# Patient Record
Sex: Female | Born: 1985 | Race: Black or African American | Hispanic: No | Marital: Single | Smoking: Never smoker
Health system: Southern US, Community
[De-identification: ages and names within clinical notes are randomized; demographics above are authoritative.]

## PROBLEM LIST (undated history)

## (undated) DIAGNOSIS — F329 Major depressive disorder, single episode, unspecified: Secondary | ICD-10-CM

## (undated) DIAGNOSIS — F32A Depression, unspecified: Secondary | ICD-10-CM

## (undated) DIAGNOSIS — F419 Anxiety disorder, unspecified: Secondary | ICD-10-CM

---

## 2006-05-05 ENCOUNTER — Ambulatory Visit: Payer: Self-pay | Admitting: Family Medicine

## 2006-05-05 LAB — CONVERTED CEMR LAB
CO2: 27 meq/L (ref 19–32)
Creatinine, Ser: 0.9 mg/dL (ref 0.4–1.2)
Glucose, Bld: 85 mg/dL (ref 70–99)
Potassium: 4 meq/L (ref 3.5–5.1)
Sodium: 138 meq/L (ref 135–145)

## 2006-05-06 ENCOUNTER — Ambulatory Visit (HOSPITAL_COMMUNITY): Admission: RE | Admit: 2006-05-06 | Discharge: 2006-05-06 | Payer: Self-pay | Admitting: Family Medicine

## 2016-07-11 ENCOUNTER — Emergency Department (HOSPITAL_COMMUNITY)
Admission: EM | Admit: 2016-07-11 | Discharge: 2016-07-11 | Disposition: A | Discharge status: Home / Self Care | Payer: No Typology Code available for payment source | Attending: Emergency Medicine | Admitting: Emergency Medicine

## 2016-07-11 ENCOUNTER — Emergency Department (HOSPITAL_COMMUNITY): Payer: No Typology Code available for payment source

## 2016-07-11 ENCOUNTER — Encounter (HOSPITAL_COMMUNITY): Payer: Self-pay | Admitting: Emergency Medicine

## 2016-07-11 DIAGNOSIS — M25511 Pain in right shoulder: Secondary | ICD-10-CM | POA: Insufficient documentation

## 2016-07-11 DIAGNOSIS — Y939 Activity, unspecified: Secondary | ICD-10-CM | POA: Diagnosis not present

## 2016-07-11 DIAGNOSIS — F17200 Nicotine dependence, unspecified, uncomplicated: Secondary | ICD-10-CM | POA: Insufficient documentation

## 2016-07-11 DIAGNOSIS — M545 Low back pain, unspecified: Secondary | ICD-10-CM

## 2016-07-11 DIAGNOSIS — Y92410 Unspecified street and highway as the place of occurrence of the external cause: Secondary | ICD-10-CM | POA: Diagnosis not present

## 2016-07-11 DIAGNOSIS — Y999 Unspecified external cause status: Secondary | ICD-10-CM | POA: Diagnosis not present

## 2016-07-11 MED ORDER — IBUPROFEN 600 MG PO TABS: 600.0000 mg | ORAL_TABLET | Freq: Four times a day (QID) | ORAL | 0 refills | Status: DC | PRN

## 2016-07-11 MED ORDER — IBUPROFEN 400 MG PO TABS
600.0000 mg | ORAL_TABLET | Freq: Once | ORAL | Status: AC
  Administered 2016-07-11: 600 mg via ORAL
  Filled 2016-07-11: qty 1

## 2016-07-11 MED ORDER — CYCLOBENZAPRINE HCL 5 MG PO TABS: 5.0000 mg | ORAL_TABLET | Freq: Two times a day (BID) | ORAL | 0 refills | Status: DC | PRN

## 2016-07-11 MED ORDER — CYCLOBENZAPRINE HCL 10 MG PO TABS
5.0000 mg | ORAL_TABLET | Freq: Once | ORAL | Status: AC
  Administered 2016-07-11: 5 mg via ORAL
  Filled 2016-07-11: qty 1
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
## 2016-12-14 ENCOUNTER — Ambulatory Visit (HOSPITAL_COMMUNITY)
Admission: RE | Admit: 2016-12-14 | Discharge: 2016-12-14 | Disposition: A | Discharge status: Home / Self Care | Payer: Self-pay | Attending: Psychiatry | Admitting: Psychiatry

## 2016-12-14 ENCOUNTER — Encounter (HOSPITAL_COMMUNITY): Payer: Self-pay | Admitting: *Deleted

## 2016-12-14 ENCOUNTER — Emergency Department (HOSPITAL_COMMUNITY)
Admission: EM | Admit: 2016-12-14 | Discharge: 2016-12-15 | Disposition: A | Discharge status: Home / Self Care | Payer: Self-pay | Attending: Emergency Medicine | Admitting: Emergency Medicine

## 2016-12-14 DIAGNOSIS — Z008 Encounter for other general examination: Secondary | ICD-10-CM | POA: Insufficient documentation

## 2016-12-14 DIAGNOSIS — F411 Generalized anxiety disorder: Secondary | ICD-10-CM | POA: Diagnosis present

## 2016-12-14 DIAGNOSIS — Z1330 Encounter for screening examination for mental health and behavioral disorders, unspecified: Secondary | ICD-10-CM | POA: Insufficient documentation

## 2016-12-14 DIAGNOSIS — F39 Unspecified mood [affective] disorder: Secondary | ICD-10-CM | POA: Insufficient documentation

## 2016-12-14 DIAGNOSIS — F17210 Nicotine dependence, cigarettes, uncomplicated: Secondary | ICD-10-CM | POA: Insufficient documentation

## 2016-12-14 DIAGNOSIS — F319 Bipolar disorder, unspecified: Secondary | ICD-10-CM | POA: Insufficient documentation

## 2016-12-14 HISTORY — DX: Anxiety disorder, unspecified: F41.9

## 2016-12-14 HISTORY — DX: Major depressive disorder, single episode, unspecified: F32.9

## 2016-12-14 HISTORY — DX: Depression, unspecified: F32.A

## 2016-12-14 MED ORDER — NICOTINE 21 MG/24HR TD PT24: 21.0000 mg | MEDICATED_PATCH | Freq: Every day | TRANSDERMAL | Status: DC

## 2016-12-14 MED ORDER — IBUPROFEN 200 MG PO TABS: 600.0000 mg | ORAL_TABLET | Freq: Three times a day (TID) | ORAL | Status: DC | PRN

## 2016-12-14 MED ORDER — ONDANSETRON HCL 4 MG PO TABS: 4.0000 mg | ORAL_TABLET | Freq: Three times a day (TID) | ORAL | Status: DC | PRN

## 2016-12-14 NOTE — ED Triage Notes (Signed)
Pt lives with her mother and [facility] was concerned [facility]  [facility] get [facility].  Pts mom main concern is the pts depression, anger and verbally aggressive.  Pt "promised" her parents she would be evaluated.  Denies SI/HI.  Denies AVH.

## 2016-12-14 NOTE — BH Assessment (Signed)
Amand[facility]Spencer Simon[facility]. TTS to work on placement. Assessment has been completed.

## 2016-12-14 NOTE — BH Assessment (Signed)
Assessment Note  Amanda Burnett is an 31 y.o. female presenting to [facility] with her mother and a family friend. The patient presents with manic behavior, grandiose thought, mood instability and reports of decreased sleep. The patient was labile, mumbling to herself and times, had rapid speech. Denied regular drug use but reports occasional use of cannabis. The patient lost her housing recently, reportedly cant hold a job and unable to finish college course work in the past.   The patient denies previous inpatient or outpatient. The patient has the support of her mother. Denies SI, HI and A/V but admits she is not functioning well.  The patient had fair eye contact, was restless, had impaired judgment and insight.   Donell Sievert, NP recommends inpatient treatment.   Diagnosis: r/o Bipolar I disorder, recent episode manic  Past Medical History: No past medical history on file.  No past surgical history on file.  Family History: No family history on file.  Social History:  reports that she has been smoking.  She has never used smokeless tobacco. She reports that she drinks alcohol. She reports that she does not use drugs.  Additional Social History:  Alcohol / Drug Use Pain Medications: see MAR Prescriptions: see MAR Over the Counter: see MAR History of alcohol / drug use?: Yes Substance #1 Name of Substance 1: cannabis 1 - Last Use / Amount: occasional use  CIWA:   COWS:    Allergies: No Known Allergies  Home Medications:  (Not in a hospital admission)  OB/GYN Status:  No LMP recorded.  General Assessment Data Location of Assessment: [facility] Assessment Services TTS Assessment: In system Is this a Tele or Face-to-Face Assessment?: Face-to-Face Is this an Initial Assessment or a Re-assessment for this encounter?: Initial Assessment Marital status: Single Maiden name: Dohmen Is patient pregnant?: No Pregnancy Status: No Living Arrangements: Parent Can pt return to current living  arrangement?: Yes Admission Status: Voluntary Is patient capable of signing voluntary admission?: Yes Referral Source: Self/Family/Friend Insurance type: self pay  Medical Screening Exam [facility] Walk-in ONLY) Medical Exam completed: Yes  Crisis Care Plan Living Arrangements: Parent Name of Psychiatrist: n/a Name of Therapist: n/a  Education Status Is patient currently in school?: No Highest grade of school patient has completed: some college  Risk to self with the past 6 months Suicidal Ideation: No Has patient been a risk to self within the past 6 months prior to admission? : No Suicidal Intent: No Has patient had any suicidal intent within the past 6 months prior to admission? : No Is patient at risk for suicide?: No Suicidal Plan?: No Has patient had any suicidal plan within the past 6 months prior to admission? : No Access to Means: No What has been your use of drugs/alcohol within the last 12 months?: n/a Previous Attempts/Gestures: No How many times?: 0 Other Self Harm Risks: 0 Triggers for Past Attempts: None known Intentional Self Injurious Behavior: None Family Suicide History: Unknown Recent stressful life event(s): Other (Comment) Persecutory voices/beliefs?: No Depression: No Substance abuse history and/or treatment for substance abuse?: No Suicide prevention information given to non-admitted patients: Not applicable  Risk to Others within the past 6 months Homicidal Ideation: No Does patient have any lifetime risk of violence toward others beyond the six months prior to admission? : No Thoughts of Harm to Others: No Current Homicidal Intent: No Current Homicidal Plan: No Access to Homicidal Means: No Identified Victim: n/a History of harm to others?: No Assessment of Violence: None Noted Violent  Behavior Description: n/a Does patient have access to weapons?: No Criminal Charges Pending?: No Does patient have a court date: No Is patient on probation?:  No  Psychosis Hallucinations: None noted Delusions: Grandiose, Unspecified  Mental Status Report Appearance/Hygiene: Unremarkable Eye Contact: Fair Motor Activity: Restlessness Speech: Rapid Level of Consciousness: Restless Mood: Labile Affect: Labile Anxiety Level: Minimal Thought Processes: Coherent, Relevant Judgement: Impaired Orientation: Person, Place, Time, Situation Obsessive Compulsive Thoughts/Behaviors: None  Cognitive Functioning Concentration: Normal Memory: Recent Intact, Remote Intact IQ: Average Insight: Good Impulse Control: Good Appetite: Fair Weight Loss: 0 Weight Gain: 0 Sleep: Decreased Vegetative Symptoms: None  ADLScreening [facility] Patient's cognitive ability adequate to safely complete daily activities?: Yes Patient able to express need for assistance with ADLs?: Yes Independently performs ADLs?: Yes (appropriate for developmental age)  Prior Inpatient Therapy Prior Inpatient Therapy: No  Prior Outpatient Therapy Prior Outpatient Therapy: No Does patient have an ACCT team?: No Does patient have Intensive In-House Services?  : No Does patient have [facility] services? : No Does patient have P4CC services?: No  ADL Screening (condition at time of admission) Patient's cognitive ability adequate to safely complete daily activities?: Yes Is the patient deaf or have difficulty hearing?: No Does the patient have difficulty seeing, even when wearing glasses/contacts?: No Does the patient have difficulty concentrating, remembering, or making decisions?: No Patient able to express need for assistance with ADLs?: Yes Does the patient have difficulty dressing or bathing?: No Independently performs ADLs?: Yes (appropriate for developmental age)       Abuse/Neglect Assessment (Assessment to be complete while patient is alone) Physical Abuse:  (UTA) Verbal Abuse:  (UTA) Sexual Abuse:  (UTA)     Advance Directives (For  Healthcare) Does Patient Have a Medical Advance Directive?: No    Additional Information 1:1 In Past 12 Months?: No CIRT Risk: No Elopement Risk: No Does patient have medical clearance?: Yes     Disposition:  Disposition Initial Assessment Completed for this Encounter: Yes Disposition of Patient: Inpatient treatment program Type of inpatient treatment program: Adult  On Site Evaluation by:   Reviewed with Physician:  Donell SievertSpencer Simon, NP  Vonzell SchlatterAshley H Othon Guardia 12/14/2016 10:40 PM

## 2016-12-14 NOTE — ED Notes (Signed)
ED Provider at bedside. 

## 2016-12-14 NOTE — ED Notes (Signed)
Urine specimen requested 

## 2016-12-14 NOTE — ED Provider Notes (Signed)
[facility]-EMERGENCY DEPT Provider Note   CSN: [id number] Arrival date & time: 12/14/16  2215     History   Chief Complaint Chief Complaint  Patient presents with  . Medical Clearance    HPI Amanda Burnett is a 31 y.o. female.  31 year old female presents from behavioral health for medical clearance.  She was seen earlier today and recommended for inpatient treatment.  Patient denies any suicidal or homicidal ideations.  She was brought to behavioral health by her mother for depression, anger, and verbal aggression.  Patient has no complaints at this time.  TTS seeking placement.      Past Medical History:  Diagnosis Date  . Anxiety   . Depression     There are no active problems to display for this patient.   History reviewed. No pertinent surgical history.  OB History    No data available       Home Medications    Prior to Admission medications   Not on File    Family History History reviewed. No pertinent family history.  Social History Social History  Substance Use Topics  . Smoking status: Current Some Day Smoker  . Smokeless tobacco: Never Used     Comment: socially  . Alcohol use Yes     Comment: drinks 2 days a week, beer, wine & liquor, binge drinking     Allergies   Patient has no known allergies.   Review of Systems Review of Systems Ten systems reviewed and are negative for acute change, except as noted in the HPI.    Physical Exam Updated Vital Signs BP 134/83 (BP Location: Right Arm)   Pulse 93   Temp 99.6 F (37.6 C) (Oral)   Resp 16   Ht 5\' 6"  (1.676 m)   Wt 56.7 kg (125 lb)   LMP 11/14/2016 (Approximate)   SpO2 100%   BMI 20.18 kg/m   Physical Exam  Constitutional: She is oriented to person, place, and time. She appears well-developed and well-nourished. No distress.  Nontoxic appearing; pleasant  HENT:  Head: Normocephalic and atraumatic.  Eyes: Conjunctivae and EOM are normal. No  scleral icterus.  Neck: Normal range of motion.  Cardiovascular: Normal rate, regular rhythm and intact distal pulses.   Pulmonary/Chest: Effort normal. No respiratory distress.  Respirations even and unlabored  Musculoskeletal: Normal range of motion.  Neurological: She is alert and oriented to person, place, and time. She exhibits normal muscle tone. Coordination normal.  Skin: Skin is warm and dry. No rash noted. She is not diaphoretic. No erythema. No pallor.  Psychiatric: She has a normal mood and affect. Her behavior is normal.  Nursing note and vitals reviewed.    ED Treatments / Results  Labs (all labs ordered are listed, but only abnormal results are displayed) Labs Reviewed  COMPREHENSIVE METABOLIC PANEL - Abnormal; Notable for the following:       Result Value   CO2 21 (*)    Glucose, Bld 111 (*)    All other components within normal limits  ETHANOL  CBC  RAPID URINE DRUG SCREEN, HOSP PERFORMED  I-STAT BETA HCG BLOOD, ED ([facility], [facility], [facility] ONLY)    EKG  EKG Interpretation None       Radiology No results found.  Procedures Procedures (including critical care time)  Medications Ordered in ED Medications  ibuprofen (ADVIL,MOTRIN) tablet 600 mg (not administered)  ondansetron (ZOFRAN) tablet 4 mg (not administered)  nicotine (NICODERM CQ - dosed in mg/24 hours)  patch 21 mg (not administered)     Initial Impression / Assessment and Plan / ED Course  I have reviewed the triage vital signs and the nursing notes.  Pertinent labs & imaging results that were available during my care of the patient were reviewed by me and considered in my medical decision making (see chart for details).     [facility]  Psychiatric team recommending inpatient admission.  Placement pending.  The patient has been medically cleared.  Disposition to be determined by oncoming ED provider.   Final Clinical Impressions(s) / ED Diagnoses   Final  diagnoses:  Mood disorder [facility](HCC)  Medical clearance for psychiatric admission    New Prescriptions New Prescriptions   No medications on file     Antony MaduraHumes, Jedediah Noda, Cordelia Poche-C 12/15/16 0304    Shon BatonHorton, Courtney F, MD 12/15/16 [phone]

## 2016-12-15 DIAGNOSIS — R450 Nervousness: Secondary | ICD-10-CM

## 2016-12-15 DIAGNOSIS — F17210 Nicotine dependence, cigarettes, uncomplicated: Secondary | ICD-10-CM

## 2016-12-15 DIAGNOSIS — F309 Manic episode, unspecified: Secondary | ICD-10-CM

## 2016-12-15 DIAGNOSIS — F419 Anxiety disorder, unspecified: Secondary | ICD-10-CM

## 2016-12-15 DIAGNOSIS — F411 Generalized anxiety disorder: Secondary | ICD-10-CM

## 2016-12-15 LAB — COMPREHENSIVE METABOLIC PANEL
ALBUMIN: 4.6 g/dL (ref 3.5–5.0)
ALT: 16 U/L (ref 14–54)
AST: 19 U/L (ref 15–41)
Alkaline Phosphatase: 73 U/L (ref 38–126)
Anion gap: 10 (ref 5–15)
BILIRUBIN TOTAL: 1.1 mg/dL (ref 0.3–1.2)
BUN: 12 mg/dL (ref 6–20)
CHLORIDE: 106 mmol/L (ref 101–111)
CO2: 21 mmol/L — AB (ref 22–32)
Calcium: 9.2 mg/dL (ref 8.9–10.3)
Creatinine, Ser: 0.93 mg/dL (ref 0.44–1.00)
GFR calc Af Amer: >60 mL/min (ref >60)
GFR calc non Af Amer: >60 mL/min (ref >60)
GLUCOSE: 111 mg/dL — AB (ref 65–99)
POTASSIUM: 3.6 mmol/L (ref 3.5–5.1)
Sodium: 137 mmol/L (ref 135–145)
TOTAL PROTEIN: 7.9 g/dL (ref 6.5–8.1)

## 2016-12-15 LAB — CBC
HEMATOCRIT: 40.7 % (ref 36.0–46.0)
HEMOGLOBIN: 14.1 g/dL (ref 12.0–15.0)
MCH: 31.6 pg (ref 26.0–34.0)
MCHC: 34.6 g/dL (ref 30.0–36.0)
MCV: 91.3 fL (ref 78.0–100.0)
Platelets: 273 10*3/uL (ref 150–400)
RBC: 4.46 MIL/uL (ref 3.87–5.11)
RDW: 12.7 % (ref 11.5–15.5)
WBC: 6 10*3/uL (ref 4.0–10.5)

## 2016-12-15 LAB — PREGNANCY, URINE: Preg Test, Ur: NEGATIVE

## 2016-12-15 LAB — ETHANOL: Alcohol, Ethyl (B): <10 mg/dL (ref <10)

## 2016-12-15 NOTE — BH Assessment (Signed)
[facility] Assessment Progress Note  Per Jacqueline Norman, DO, this pt does not require psychiatric hospitalization at this time.  Pt is to be discharged from [facility] with recommendation to follow up with [facility].  This has been included in pt's discharge instructions.  Pt's nurse, Cynthia, has been notified.  Sacha Radloff, MA Triage Specialist [phone]     

## 2016-12-15 NOTE — Consult Note (Signed)
[facility] Consult   Reason for Consult:  Manic behavior Referring Physician:  EDP Patient Identification: Amanda Burnett MRN:  [id number] Principal Diagnosis: Generalized anxiety disorder Diagnosis:   Patient Active Problem List   Diagnosis Date Noted  . Generalized anxiety disorder [F41.1] 12/15/2016    Total Time spent with patient: 45 minutes  Subjective:   Amanda Burnett is a 31 y.o. female patient admitted with increased anxiety and manic behavior.  HPI:  Pt was seen and chart reviewed with treatment team and Dr Mariea Clonts. Pt stated she is currently living with her mother and her mother became concerned because she has been having excessive worry and increased anxiety. Pt stated she currently is not working and goes to bed early and getting up early. Pt stated she thinks her mother may be mistaking this as her not sleeping. Today,  Pt denies suicidal/homicidal ideation, denies auditory/visual hallucinations and does not appear to be responding to internal stimuli. Pt is calm and cooperative and appropriate during the assessment. Pt will be referred to [facility] for therapy to help with her anxiety. Pt is stable and psychiatrically clear for discharge.   Past Psychiatric History: As above  Risk to Self: Is patient at risk for suicide?: No Risk to Others:  None Prior Inpatient Therapy:  Denies  Prior Outpatient Therapy:   She has seen a therapist in the past.   Past Medical History:  Past Medical History:  Diagnosis Date  . Anxiety   . Depression    History reviewed. No pertinent surgical history. Family History: History reviewed. No pertinent family history. Family Psychiatric  History: Unknown Social History:  History  Alcohol Use  . Yes    Comment: drinks 2 days a week, beer, wine & liquor, binge drinking     History  Drug Use No    Social History   Social History  . Marital status: Single    Spouse name: N/A  . Number of  children: N/A  . Years of education: N/A   Social History Main Topics  . Smoking status: Current Some Day Smoker  . Smokeless tobacco: Never Used     Comment: socially  . Alcohol use Yes     Comment: drinks 2 days a week, beer, wine & liquor, binge drinking  . Drug use: No  . Sexual activity: Yes   Other Topics Concern  . None   Social History Narrative  . None   Additional Social History: She lives with her mother.     Allergies:  No Known Allergies  Labs:  Results for orders placed or performed during the hospital encounter of 12/14/16 (from the past 48 hour(s))  Comprehensive metabolic panel     Status: Abnormal   Collection Time: 12/14/16 11:47 PM  Result Value Ref Range   Sodium 137 135 - 145 mmol/L   Potassium 3.6 3.5 - 5.1 mmol/L   Chloride 106 101 - 111 mmol/L   CO2 21 (L) 22 - 32 mmol/L   Glucose, Bld 111 (H) 65 - 99 mg/dL   BUN 12 6 - 20 mg/dL   Creatinine, Ser 0.93 0.44 - 1.00 mg/dL   Calcium 9.2 8.9 - 10.3 mg/dL   Total Protein 7.9 6.5 - 8.1 g/dL   Albumin 4.6 3.5 - 5.0 g/dL   AST 19 15 - 41 U/L   ALT 16 14 - 54 U/L   Alkaline Phosphatase 73 38 - 126 U/L   Total Bilirubin 1.1 0.3 -  1.2 mg/dL   GFR calc non Af Amer >60 >60 mL/min   GFR calc Af Amer >60 >60 mL/min    Comment: (NOTE) The eGFR has been calculated using the CKD EPI equation. This calculation has not been validated in all clinical situations. eGFR's persistently <60 mL/min signify possible Chronic Kidney Disease.    Anion gap 10 5 - 15  Ethanol     Status: None   Collection Time: 12/14/16 11:47 PM  Result Value Ref Range   Alcohol, Ethyl (B) <10 <10 mg/dL    Comment:        LOWEST DETECTABLE LIMIT FOR SERUM ALCOHOL IS 10 mg/dL FOR MEDICAL PURPOSES ONLY   cbc     Status: None   Collection Time: 12/14/16 11:47 PM  Result Value Ref Range   WBC 6.0 4.0 - 10.5 K/uL   RBC 4.46 3.87 - 5.11 MIL/uL   Hemoglobin 14.1 12.0 - 15.0 g/dL   HCT 40.7 36.0 - 46.0 %   MCV 91.3 78.0 - 100.0 fL    MCH 31.6 26.0 - 34.0 pg   MCHC 34.6 30.0 - 36.0 g/dL   RDW 12.7 11.5 - 15.5 %   Platelets 273 150 - 400 K/uL  Pregnancy, urine     Status: None   Collection Time: 12/15/16  8:35 AM  Result Value Ref Range   Preg Test, Ur NEGATIVE NEGATIVE    Comment:        THE SENSITIVITY OF THIS METHODOLOGY IS >20 mIU/mL.     Current Facility-Administered Medications  Medication Dose Route Frequency Provider Last Rate Last Dose  . ibuprofen (ADVIL,MOTRIN) tablet 600 mg  600 mg Oral Q8H PRN Antonietta Breach, PA-C      . nicotine (NICODERM CQ - dosed in mg/24 hours) patch 21 mg  21 mg Transdermal Daily Humes, Claiborne Billings, PA-C      . ondansetron (ZOFRAN) tablet 4 mg  4 mg Oral Q8H PRN Antonietta Breach, PA-C       No current outpatient prescriptions on file.    Musculoskeletal: Strength & Muscle Tone: within normal limits Gait & Station: normal Patient leans: N/A  Psychiatric Specialty Exam: Physical Exam  Constitutional: She is oriented to person, place, and time. She appears well-developed and well-nourished.  HENT:  Head: Normocephalic.  Respiratory: Effort normal.  Musculoskeletal: Normal range of motion.  Neurological: She is alert and oriented to person, place, and time.  Psychiatric: Her speech is normal and behavior is normal. Judgment and thought content normal. Her mood appears anxious. Cognition and memory are normal.    Review of Systems  Psychiatric/Behavioral: Positive for depression. Negative for hallucinations, memory loss, substance abuse and suicidal ideas. The patient is nervous/anxious. The patient does not have insomnia.   All other systems reviewed and are negative.   Blood pressure 111/61, pulse 83, temperature 98.6 F (37 C), temperature source Oral, resp. rate 16, height _0  (1.676 m), weight 56.7 kg (125 lb), last menstrual period 11/14/2016, SpO2 100 %.Body mass index is 20.18 kg/m.  General Appearance: Casual  Eye Contact:  Good  Speech:  Clear and Coherent  Volume:   Normal  Mood:  Anxious  Affect:  Congruent  Thought Process:  Coherent, Goal Directed and Linear  Orientation:  Full (Time, Place, and Person)  Thought Content:  Logical  Suicidal Thoughts:  No  Homicidal Thoughts:  No  Memory:  Immediate;   Good Recent;   Good Remote;   Fair  Judgement:  Good  Insight:  Good  Psychomotor Activity:  Normal  Concentration:  Concentration: Good and Attention Span: Good  Recall:  Good  Fund of Knowledge:  Good  Language:  Good  Akathisia:  No  Handed:  Right  AIMS (if indicated):     Assets:  Communication Skills Desire for Improvement Financial Resources/Insurance Housing Physical Health Resilience Social Support  ADL's:  Intact  Cognition:  WNL  Sleep:   Okay     Treatment Plan Summary: Plan Generalized Anxiety Disorder  Discharge Home Follow up with [facility] Take all medications as prescribed Avoid the use of alcohol and illicit drugs  Disposition: No evidence of imminent risk to self or others at present.   Patient does not meet criteria for psychiatric inpatient admission. Supportive therapy provided about ongoing stressors. Discussed crisis plan, support from social network, calling 911, coming to the Emergency Department, and calling Suicide Hotline.  Ethelene Hal, NP 12/15/2016 1:05 PM   Patient seen face-to-face for psychiatric evaluation, chart reviewed and case discussed with the physician extender and developed treatment plan. Reviewed the information documented and agree with the treatment plan.  Buford Dresser, DO

## 2016-12-15 NOTE — H&P (Signed)
Behavioral Health Medical Screening Exam  Amanda Burnett is an 31 y.o. female whom presents with her mother and a family friend, due to exacerbated odd, erratic, impulsive and volatile behaviors by [place], over the past few months. Katianna is endorsing worrying, rumination, racing thoughts, mid-insomnia, impulsivity, mood dysregulation and AVH.The patient was labile, mumbling to herself and times, had rapid speech. Denied regular drug use but reports occasional use of cannabis. The patient lost her housing recently, reportedly cant hold a job and unable to finish college course work in the past.   The patient denies previous inpatient or outpatient. The patient has the support of her mother. Denies SI, HI and A/V but admits she is not functioning well.  The patient had fair eye contact, was restless, had impaired judgment and insight.   Total Time spent with patient: 30 minutes  Psychiatric Specialty Exam: Physical Exam  Constitutional: She is oriented to person, place, and time. She appears well-developed and well-nourished. No distress.  HENT:  Head: Normocephalic.  Eyes: Pupils are equal, round, and reactive to light.  Neck: Normal range of motion.  Respiratory: Effort normal and breath sounds normal. No respiratory distress.  GI: Soft.  Musculoskeletal: She exhibits no edema.  Neurological: She is alert and oriented to person, place, and time. No cranial nerve deficit.  Skin: Skin is warm and dry. She is not diaphoretic.  Psychiatric: Her mood appears anxious. Her speech is rapid and/or pressured. She is agitated and hyperactive. Thought content is paranoid. She expresses impulsivity.    Review of Systems  Psychiatric/Behavioral: Positive for depression, hallucinations and substance abuse. Negative for suicidal ideas. The patient is nervous/anxious and has insomnia.   All other systems reviewed and are negative.   Last menstrual period 11/14/2016.There is no height or weight on file  to calculate BMI.  General Appearance: Casual  Eye Contact:  Fair  Speech:  Pressured  Volume:  Increased  Mood:  Anxious  Affect:  Congruent  Thought Process:  Irrelevant  Orientation:  Full (Time, Place, and Person)  Thought Content:  Rumination  Suicidal Thoughts:  No  Homicidal Thoughts:  No  Memory:  Immediate;   Fair  Judgement:  Impaired  Insight:  Lacking  Psychomotor Activity:  Negative  Concentration: Concentration: Poor  Recall:  FiservFair  Fund of Knowledge:Fair  Language: Fair  Akathisia:  Negative  Handed:  Right  AIMS (if indicated):     Assets:  Social Support  Sleep:       Musculoskeletal: Strength & Muscle Tone: within normal limits Gait & Station: normal Patient leans: N/A  Last menstrual period 11/14/2016.  Recommendations:  Based on my evaluation the patient appears to have an emergency medical condition for which I recommend the patient be transferred to the emergency department for further evaluation.  Kerry HoughSpencer E Celeste Candelas, PA-C 12/15/2016, 3:59 AM

## 2016-12-15 NOTE — ED Notes (Signed)
Pt shown to lobby.  Verbalized understanding discharge instructions. In no acute distress.  ? ?

## 2016-12-15 NOTE — [facility] Suicide Risk Assessment (Signed)
Suicide Risk Assessment  Discharge Assessment   [facility]   Principal Problem: Generalized anxiety disorder Discharge Diagnoses:  Patient Active Problem List   Diagnosis Date Noted  . Generalized anxiety disorder [F41.1] 12/15/2016    Total Time spent with patient: 45 minutes  Musculoskeletal: Strength & Muscle Tone: within normal limits Gait & Station: normal Patient leans: N/A  Psychiatric Specialty Exam: Physical Exam  Constitutional: She is oriented to person, place, and time. She appears well-developed and well-nourished.  HENT:  Head: Normocephalic.  Respiratory: Effort normal.  Musculoskeletal: Normal range of motion.  Neurological: She is alert and oriented to person, place, and time.  Psychiatric: Her behavior is normal. Thought content normal. Her mood appears anxious. Her speech is rapid and/or pressured. Cognition and memory are normal. She expresses impulsivity.   Review of Systems  Psychiatric/Behavioral: Positive for depression. Negative for hallucinations, memory loss, substance abuse and suicidal ideas. The patient is nervous/anxious. The patient does not have insomnia.   All other systems reviewed and are negative.  Blood pressure 111/61, pulse 83, temperature 98.6 F (37 C), temperature source Oral, resp. rate 16, height 5\' 6"  (1.676 m), weight 56.7 kg (125 lb), last menstrual period 11/14/2016, SpO2 100 %.Body mass index is 20.18 kg/m. General Appearance: Casual Eye Contact:  Good Speech:  Clear and Coherent Volume:  Normal Mood:  Anxious Affect:  Congruent Thought Process:  Coherent, Goal Directed and Linear Orientation:  Full (Time, Place, and Person) Thought Content:  Logical Suicidal Thoughts:  No Homicidal Thoughts:  No Memory:  Immediate;   Good Recent;   Good Remote;   Fair Judgement:  Good Insight:  Good Psychomotor Activity:  Normal Concentration:  Concentration: Good and Attention Span: Good Recall:   Good Fund of Knowledge:  Good Language:  Good Akathisia:  No Handed:  Right AIMS (if indicated):    Assets:  Communication Skills Desire for Improvement Financial Resources/Insurance Housing Physical Health Resilience Social Support ADL's:  Intact Cognition:  WNL   Mental Status Per Nursing Assessment::   On Admission:   anxious  Demographic Factors:  Low socioeconomic status and Unemployed  Loss Factors: Financial problems/change in socioeconomic status  Historical Factors: Impulsivity  Risk Reduction Factors:   Sense of responsibility to family, Living with another person, especially a relative and Positive social support  Continued Clinical Symptoms:  Severe Anxiety and/or Agitation  Cognitive Features That Contribute To Risk:  Closed-mindedness    Suicide Risk:  Minimal: No identifiable suicidal ideation.  Patients presenting with no risk factors but with morbid ruminations; may be classified as minimal risk based on the severity of the depressive symptoms    Plan Of Care/Follow-up recommendations:  Activity:  as tolerated Diet:  Heart Healthy  Amanda AbbeLaurie Britton [name], NP 12/15/2016, 1:28 PM

## 2016-12-15 NOTE — ED Notes (Addendum)
Pt provided a wash clothed.  Pt noted to be cleaning up and getting dressed.  Sts ride will be here shortly.

## 2016-12-15 NOTE — Discharge Instructions (Signed)
For your behavioral health needs you are advised to follow up with [facility].  New patients are seen at their walk-in clinic.  Walk-in hours are Monday - Friday from 8:00 am - 12:00 pm, and from 1:00 pm - 3:00 pm.  Walk-in patients are seen on a first come, first served basis, so try to arrive as early as possible for the best chance of being seen the same day.  There is an initial fee of $22.50: ° °     [facility] °     [address] °     [place], [place] [address] °     [phone] °

## 2016-12-15 NOTE — ED Notes (Signed)
Pt asking previous RN about obtaining medical records.  Pt given directions to contact medical record department.  Verbalized understanding.

## 2018-06-12 ENCOUNTER — Emergency Department (HOSPITAL_COMMUNITY)
Admission: EM | Admit: 2018-06-12 | Discharge: 2018-06-13 | Disposition: A | Discharge status: Other Acute Inpatient Hospital | Payer: Self-pay | Attending: Emergency Medicine | Admitting: Emergency Medicine

## 2018-06-12 ENCOUNTER — Other Ambulatory Visit: Payer: Self-pay

## 2018-06-12 ENCOUNTER — Encounter (HOSPITAL_COMMUNITY): Payer: Self-pay

## 2018-06-12 DIAGNOSIS — F4320 Adjustment disorder, unspecified: Secondary | ICD-10-CM | POA: Insufficient documentation

## 2018-06-12 DIAGNOSIS — F1010 Alcohol abuse, uncomplicated: Secondary | ICD-10-CM | POA: Insufficient documentation

## 2018-06-12 DIAGNOSIS — F329 Major depressive disorder, single episode, unspecified: Secondary | ICD-10-CM | POA: Insufficient documentation

## 2018-06-12 DIAGNOSIS — F17200 Nicotine dependence, unspecified, uncomplicated: Secondary | ICD-10-CM | POA: Insufficient documentation

## 2018-06-12 DIAGNOSIS — R4689 Other symptoms and signs involving appearance and behavior: Secondary | ICD-10-CM | POA: Insufficient documentation

## 2018-06-12 LAB — CBC
HCT: 41.8 % (ref 36.0–46.0)
Hemoglobin: 13.7 g/dL (ref 12.0–15.0)
MCH: 31.4 pg (ref 26.0–34.0)
MCHC: 32.8 g/dL (ref 30.0–36.0)
MCV: 95.9 fL (ref 80.0–100.0)
Platelets: 301 10*3/uL (ref 150–400)
RBC: 4.36 MIL/uL (ref 3.87–5.11)
RDW: 13.7 % (ref 11.5–15.5)
WBC: 5.5 10*3/uL (ref 4.0–10.5)
nRBC: 0 % (ref 0.0–0.2)

## 2018-06-12 LAB — COMPREHENSIVE METABOLIC PANEL
ALT: 15 U/L (ref 0–44)
AST: 25 U/L (ref 15–41)
Albumin: 4.2 g/dL (ref 3.5–5.0)
Alkaline Phosphatase: 80 U/L (ref 38–126)
Anion gap: 10 (ref 5–15)
BUN: 13 mg/dL (ref 6–20)
CO2: 21 mmol/L — ABNORMAL LOW (ref 22–32)
Calcium: 8.9 mg/dL (ref 8.9–10.3)
Chloride: 107 mmol/L (ref 98–111)
Creatinine, Ser: 0.8 mg/dL (ref 0.44–1.00)
GFR calc Af Amer: >60 mL/min (ref >60)
GFR calc non Af Amer: >60 mL/min (ref >60)
Glucose, Bld: 85 mg/dL (ref 70–99)
Potassium: 3.2 mmol/L — ABNORMAL LOW (ref 3.5–5.1)
Sodium: 138 mmol/L (ref 135–145)
Total Bilirubin: 0.4 mg/dL (ref 0.3–1.2)
Total Protein: 7.6 g/dL (ref 6.5–8.1)

## 2018-06-12 LAB — ETHANOL: Alcohol, Ethyl (B): <10 mg/dL (ref <10)

## 2018-06-12 LAB — I-STAT BETA HCG BLOOD, ED (MC, WL, AP ONLY): I-stat hCG, quantitative: <5 m[IU]/mL (ref <5)

## 2018-06-12 MED ORDER — POTASSIUM CHLORIDE CRYS ER 20 MEQ PO TBCR
40.0000 meq | EXTENDED_RELEASE_TABLET | Freq: Once | ORAL | Status: AC
  Administered 2018-06-12: 23:00:00 40 meq via ORAL
  Filled 2018-06-12: qty 2

## 2018-06-12 NOTE — ED Notes (Signed)
Bed: [facility] Expected date:  Expected time:  Means of arrival:  Comments: 
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
## 2018-06-13 ENCOUNTER — Inpatient Hospital Stay (HOSPITAL_COMMUNITY)
Admission: AD | Admit: 2018-06-13 | Discharge: 2018-06-19 | DRG: 885 | Disposition: A | Discharge status: Home / Self Care | Payer: Self-pay | Source: Intra-hospital | Attending: Psychiatry | Admitting: Psychiatry

## 2018-06-13 ENCOUNTER — Encounter (HOSPITAL_COMMUNITY): Payer: Self-pay | Admitting: *Deleted

## 2018-06-13 ENCOUNTER — Other Ambulatory Visit: Payer: Self-pay | Admitting: Behavioral Health

## 2018-06-13 DIAGNOSIS — F329 Major depressive disorder, single episode, unspecified: Secondary | ICD-10-CM | POA: Diagnosis present

## 2018-06-13 DIAGNOSIS — F4320 Adjustment disorder, unspecified: Secondary | ICD-10-CM | POA: Diagnosis present

## 2018-06-13 DIAGNOSIS — F200 Paranoid schizophrenia: Principal | ICD-10-CM

## 2018-06-13 DIAGNOSIS — G4700 Insomnia, unspecified: Secondary | ICD-10-CM | POA: Diagnosis present

## 2018-06-13 DIAGNOSIS — F17200 Nicotine dependence, unspecified, uncomplicated: Secondary | ICD-10-CM | POA: Diagnosis present

## 2018-06-13 LAB — RAPID URINE DRUG SCREEN, HOSP PERFORMED
Amphetamines: NOT DETECTED
Barbiturates: NOT DETECTED
Benzodiazepines: NOT DETECTED
Cocaine: NOT DETECTED
Opiates: NOT DETECTED
Tetrahydrocannabinol: NOT DETECTED

## 2018-06-13 MED ORDER — ALUM & MAG HYDROXIDE-SIMETH 200-200-20 MG/5ML PO SUSP: 30.0000 mL | ORAL | Status: DC | PRN

## 2018-06-13 MED ORDER — MAGNESIUM HYDROXIDE 400 MG/5ML PO SUSP: 30.0000 mL | Freq: Every day | ORAL | Status: DC | PRN

## 2018-06-13 MED ORDER — ACETAMINOPHEN 325 MG PO TABS: 650.0000 mg | ORAL_TABLET | Freq: Four times a day (QID) | ORAL | Status: DC | PRN

## 2018-06-13 NOTE — ED Notes (Signed)
[facility] called for transportation.  

## 2018-06-13 NOTE — ED Notes (Addendum)
Voluntary admission and consent for treatment signed by patient and witnessed by this RN.
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
## 2018-06-13 NOTE — ED Notes (Signed)
Patient escorted out of the ED . Patient transported to [facility] via [facility] and 2 bags of belongings.

## 2018-06-13 NOTE — ED Notes (Signed)
Nira Conn, recommends overnight observation due to not being able to complete collateral contact for additional information on patient. Consulting civil engineer, informed of disposition and informed TTS that parent of patient has not filed IVC paperwork at this point.

## 2018-06-13 NOTE — Progress Notes (Signed)
Adult Psychoeducational Group Note  Date:  06/13/2018 Time:  10:11 PM  Group Topic/Focus:  Wrap-Up Group:   The focus of this group is to help patients review their daily goal of treatment and discuss progress on daily workbooks.  Participation Level:  Active  Participation Quality:  Appropriate  Affect:  Appropriate  Cognitive:  Appropriate  Insight: Appropriate  Engagement in Group:  Engaged  Modes of Intervention:  Discussion  Additional Comments:  Patient attended group, but she did not participate.   Gemayel Mascio W Zaccary Creech 06/13/2018, 10:11 PM

## 2018-06-13 NOTE — ED Notes (Signed)
Patient asked to go to the bathroom. Patient given a specimen cup for UDS.

## 2018-06-13 NOTE — ED Notes (Signed)
Spoke with [facility] AC, she states the bed is ready for patient and can start to the transfer process. Patient needs an original copy of the voluntary form to go with patient. Passed this message to [place], RN in triage.

## 2018-06-13 NOTE — Tx Team (Addendum)
Initial Treatment Plan 06/13/2018 8:03 PM Amanda Burnett RTM:[id number]    PATIENT STRESSORS: Financial difficulties Marital or family conflict Occupational concerns   PATIENT STRENGTHS: Ability for insight Capable of independent living Communication skills Physical Health Special hobby/interest Supportive family/friends Work skills   PATIENT IDENTIFIED PROBLEMS: Alteration in mood (agitation, mood lability) "I get angry sometimes with my mom"  Ineffective coping skills (Aggression towards mom / others).  Alteration in sleep pattern (poor sleep for days).                 DISCHARGE CRITERIA:  Improved stabilization in mood, thinking, and/or behavior Verbal commitment to aftercare and medication compliance  PRELIMINARY DISCHARGE PLAN: Outpatient therapy Return to previous living arrangement  PATIENT/FAMILY INVOLVEMENT: This treatment plan has been presented to and reviewed with the patient, Amanda Burnett.  The and family have been given the opportunity to ask questions and make suggestions.  Sherryl Manges, RN 06/13/2018, 8:03 PM

## 2018-06-13 NOTE — Progress Notes (Signed)
Pt accepted to [facility], Bed 508-1   WHEN BED IS AVAILABLE Roosvelt Harps, DO is the accepting provider.  Nehemiah Massed, MD. is the attending provider.  Call report to [phone]  @ [facility] ED notified.   Pt is Voluntary.  Pt may be transported by [facility]  Pt scheduled  to arrive at [facility] when [facility] notifies you that bed is available.  Timmothy Euler. Kaylyn Lim, MSW, LCSWA Disposition Clinical Social Work [phone] (cell) [phone] (office)

## 2018-06-13 NOTE — Progress Notes (Signed)
D: Pt denies SI/HI/AVH. Pt is pleasant and cooperative. Pt visible in dayroom this evening. Pt has no insight into Tx. " I only came here because my mother wanted me to, I'm here voluntary, so I can leave when I want to". Tried to explain the voluntary process and 72 hr request for D/c , but pt was not receptive at this time.  A: Pt was offered support and encouragement. . Pt was encourage to attend groups. Q 15 minute checks were done for safety.  R: safety maintained on unit.
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
## 2018-06-14 DIAGNOSIS — F200 Paranoid schizophrenia: Secondary | ICD-10-CM

## 2018-06-14 LAB — HEMOGLOBIN A1C
Hgb A1c MFr Bld: 5 % (ref 4.8–5.6)
Mean Plasma Glucose: 96.8 mg/dL

## 2018-06-14 LAB — LIPID PANEL
Cholesterol: 174 mg/dL (ref 0–200)
HDL: 85 mg/dL (ref >40)
LDL Cholesterol: 77 mg/dL (ref 0–99)
Total CHOL/HDL Ratio: 2 RATIO
Triglycerides: 61 mg/dL (ref <150)
VLDL: 12 mg/dL (ref 0–40)

## 2018-06-14 LAB — TSH: TSH: 1.463 u[IU]/mL (ref 0.350–4.500)

## 2018-06-14 MED ORDER — OMEGA-3-ACID ETHYL ESTERS 1 G PO CAPS
1.0000 g | ORAL_CAPSULE | Freq: Two times a day (BID) | ORAL | Status: DC
  Administered 2018-06-14 – 2018-06-16 (×5): 1 g via ORAL
  Filled 2018-06-14 (×15): qty 1

## 2018-06-14 MED ORDER — RISPERIDONE 3 MG PO TABS
3.0000 mg | ORAL_TABLET | Freq: Two times a day (BID) | ORAL | Status: DC
  Administered 2018-06-14 – 2018-06-15 (×3): 3 mg via ORAL
  Filled 2018-06-14 (×3): qty 1
  Filled 2018-06-14: qty 14
  Filled 2018-06-14 (×3): qty 1
  Filled 2018-06-14: qty 14
  Filled 2018-06-14 (×5): qty 1
  Filled 2018-06-14: qty 14
  Filled 2018-06-14 (×2): qty 1
  Filled 2018-06-14: qty 14

## 2018-06-14 MED ORDER — BENZTROPINE MESYLATE 0.5 MG PO TABS
0.5000 mg | ORAL_TABLET | Freq: Two times a day (BID) | ORAL | Status: DC
  Administered 2018-06-14 – 2018-06-15 (×3): 0.5 mg via ORAL
  Filled 2018-06-14: qty 14
  Filled 2018-06-14 (×4): qty 1
  Filled 2018-06-14: qty 14
  Filled 2018-06-14 (×5): qty 1
  Filled 2018-06-14 (×2): qty 14
  Filled 2018-06-14 (×4): qty 1

## 2018-06-14 MED ORDER — TEMAZEPAM 30 MG PO CAPS
30.0000 mg | ORAL_CAPSULE | Freq: Every day | ORAL | Status: DC
  Administered 2018-06-14: 30 mg via ORAL
  Filled 2018-06-14: qty 1
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
## 2018-06-14 NOTE — Tx Team (Signed)
Interdisciplinary Treatment and Diagnostic Plan Update  06/14/2018 Time of Session: 9:00am Amanda Burnett MRN: [id number]  Principal Diagnosis: <principal problem not specified>  Secondary Diagnoses: Active Problems:   Adjustment disorder   Current Medications:  Current Facility-Administered Medications  Medication Dose Route Frequency Provider Last Rate Last Dose  . acetaminophen (TYLENOL) tablet 650 mg  650 mg Oral Q6H PRN Mordecai Maes, NP      . alum & mag hydroxide-simeth (MAALOX/MYLANTA) 200-200-20 MG/5ML suspension 30 mL  30 mL Oral Q4H PRN Mordecai Maes, NP      . magnesium hydroxide (MILK OF MAGNESIA) suspension 30 mL  30 mL Oral Daily PRN Mordecai Maes, NP       PTA Medications: No medications prior to admission.    Patient Stressors: Financial difficulties Marital or family conflict Occupational concerns  Patient Strengths: Ability for insight Capable of independent living Communication skills Physical Health Special hobby/interest Supportive family/friends Work skills  Treatment Modalities: Medication Management, Group therapy, Case management,  1 to 1 session with clinician, Psychoeducation, Recreational therapy.   Physician Treatment Plan for Primary Diagnosis: <principal problem not specified> Long Term Goal(s):     Short Term Goals:    Medication Management: Evaluate patient's response, side effects, and tolerance of medication regimen.  Therapeutic Interventions: 1 to 1 sessions, Unit Group sessions and Medication administration.  Evaluation of Outcomes: Not Progressing  Physician Treatment Plan for Secondary Diagnosis: Active Problems:   Adjustment disorder  Long Term Goal(s):     Short Term Goals:       Medication Management: Evaluate patient's response, side effects, and tolerance of medication regimen.  Therapeutic Interventions: 1 to 1 sessions, Unit Group sessions and Medication administration.  Evaluation of Outcomes: Not  Progressing   RN Treatment Plan for Primary Diagnosis: <principal problem not specified> Long Term Goal(s): Knowledge of disease and therapeutic regimen to maintain health will improve  Short Term Goals: Ability to participate in decision making will improve, Ability to verbalize feelings will improve, Ability to identify and develop effective coping behaviors will improve and Compliance with prescribed medications will improve  Medication Management: RN will administer medications as ordered by provider, will assess and evaluate patient's response and provide education to patient for prescribed medication. RN will report any adverse and/or side effects to prescribing provider.  Therapeutic Interventions: 1 on 1 counseling sessions, Psychoeducation, Medication administration, Evaluate responses to treatment, Monitor vital signs and CBGs as ordered, Perform/monitor CIWA, COWS, AIMS and Fall Risk screenings as ordered, Perform wound care treatments as ordered.  Evaluation of Outcomes: Not Met   LCSW Treatment Plan for Primary Diagnosis: <principal problem not specified> Long Term Goal(s): Safe transition to appropriate next level of care at discharge, Engage patient in therapeutic group addressing interpersonal concerns.  Short Term Goals: Engage patient in aftercare planning with referrals and resources, Increase social support, Increase ability to appropriately verbalize feelings and Increase skills for wellness and recovery  Therapeutic Interventions: Assess for all discharge needs, 1 to 1 time with Social worker, Explore available resources and support systems, Assess for adequacy in community support network, Educate family and significant other(s) on suicide prevention, Complete Psychosocial Assessment, Interpersonal group therapy.  Evaluation of Outcomes: Not Met   Progress in Treatment: Attending groups: Yes. Participating in groups: No. Taking medication as prescribed:  Yes. Toleration medication: Yes. Family/Significant other contact made: No, will contact:  supports if consents are granted Patient understands diagnosis: No. Discussing patient identified problems/goals with staff: No. Medical problems stabilized or resolved: No. Denies  suicidal/homicidal ideation: Yes. Issues/concerns per patient self-inventory: No.  New problem(s) identified: No, Describe:  CSW continuing to assess  New Short Term/Long Term Goal(s): medication management for mood stabilization; elimination of SI thoughts; development of comprehensive mental wellness/sobriety plan.  Patient Goals:  Discharge  Discharge Plan or Barriers: CSW continuing to assess for appropriate referrals.  Reason for Continuation of Hospitalization: Anxiety Mania Medication stabilization  Estimated Length of Stay: 5-7 days  Attendees: Patient:  06/14/2018 9:31 AM  Physician:  06/14/2018 9:31 AM  Nursing:  06/14/2018 9:31 AM  RN Care Manager: 06/14/2018 9:31 AM  Social Worker: Stephanie Acre, Latanya Presser 06/14/2018 9:31 AM  Recreational Therapist:  06/14/2018 9:31 AM  Other:  06/14/2018 9:31 AM  Other:  06/14/2018 9:31 AM  Other: 06/14/2018 9:31 AM    Scribe for Treatment Team: Joellen [place], [place] 06/14/2018 9:31 AM

## 2018-06-14 NOTE — [facility] Suicide Risk Assessment (Signed)
[facility] Admission Suicide Risk Assessment   Nursing information obtained from:  Patient Demographic factors:  Gay, lesbian, or bisexual orientation Current Mental Status:  NA Loss Factors:  Decrease in vocational status, Financial problems / change in socioeconomic status Historical Factors:  Impulsivity(Per chart) Risk Reduction Factors:  Living with another person, especially a relative, Positive social support, Positive therapeutic relationship, Positive coping skills or problem solving skills  Total Time spent with patient: 45 minutes Principal Problem: Psychotic disorder chronic under treatment Diagnosis:  Active Problems:   Adjustment disorder   Paranoid schizophrenia (HCC)  Subjective Data: Admitted for psychosis see eval but denies suicidal thinking plans or intent  Continued Clinical Symptoms:  Alcohol Use Disorder Identification Test Final Score (AUDIT): 8 The "Alcohol Use Disorders Identification Test", Guidelines for Use in Primary Care, Second Edition.  World Science writer [facility]). Score between 0-7:  no or low risk or alcohol related problems. Score between 8-15:  moderate risk of alcohol related problems. Score between 16-19:  high risk of alcohol related problems. Score 20 or above:  warrants further diagnostic evaluation for alcohol dependence and treatment.   CLINICAL FACTORS:   Schizophrenia:   Paranoid or undifferentiated type   COGNITIVE FEATURES THAT CONTRIBUTE TO RISK:  Loss of executive function    SUICIDE RISK:   Minimal: No identifiable suicidal ideation.  Patients presenting with no risk factors but with morbid ruminations; may be classified as minimal risk based on the severity of the depressive symptoms  PLAN OF CARE: Admit for stabilization and diagnostic clarity  I certify that inpatient services furnished can reasonably be expected to improve the patient's condition.   Malvin Johns, MD 06/14/2018, 9:37 AM

## 2018-06-14 NOTE — Plan of Care (Signed)
Patient denies SI HI AVH. Denies physical pain. Patient slept well last night.  Problem: Education: Goal: Emotional status will improve Outcome: Progressing Goal: Mental status will improve Outcome: Progressing Goal: Verbalization of understanding the information provided will improve Outcome: Progressing

## 2018-06-14 NOTE — Progress Notes (Signed)
The focus of this group is to help patients establish daily goals to achieve during treatment and discuss how the patient can incorporate goal setting into their daily lives to aide in recovery.  Pt attended goals group, pt has no insight. Pt. Stated that she is "here" and that was her only input into group.

## 2018-06-14 NOTE — Progress Notes (Signed)
Recreation Therapy Notes  Date: 4.29.20 Time: 1000 Location: 500 Hall Dayroom  Group Topic: Wellness  Goal Area(s) Addresses:  Patient will define components of whole wellness. Patient will verbalize benefit of whole wellness.  Behavioral Response:  Engaged  Intervention:  Exercise, Music   Activity:  Exercise.  LRT lead group in a series of stretches to loosen them up before going into the exercises.  Each patient got the opportunity to lead the group in an exercise of their choice.  The group was to get at least 30 minutes of exercise.  Patients could take water breaks/breaks as needed.  Education: Wellness, Building control surveyor.   Education Outcome: Acknowledges education/In group clarification offered/Needs additional education.   Clinical Observations/Feedback:  Pt was quiet but engaged when prompted.  Pt would smile at times.  Pt lead group in arm exercises and completed most of the exercises led by her peers.  Pt sat and observed for the last 10 minutes of group.    Caroll Rancher, LRT/CTRS      Caroll Rancher A 06/14/2018 11:21 AM

## 2018-06-14 NOTE — Progress Notes (Signed)
Recreation Therapy Notes  INPATIENT RECREATION THERAPY ASSESSMENT  Patient Details Name: Amanda Burnett MRN: [id number] DOB: 08-08-1985 Today's Date: 06/14/2018       Information Obtained From: Patient  Able to Participate in Assessment/Interview: Yes  Patient Presentation: Oriented, Alert  Reason for Admission (Per Patient): ("My mom had some concerns")  Patient Stressors: (None identified)  Coping Skills:   Sports, TV, Music, Talk, Art, Prayer, Avoidance, Read  Leisure Interests (2+):  Community - [employer], Individual - Other (Comment)(Eat, Drink)  Frequency of Recreation/Participation: Other (Comment)(Daily)  Awareness of Community Resources:  Yes  Community Resources:  Park, Other (Comment)(School)  Current Use: Yes(Has used park)  If no, Barriers?:    Expressed Interest in [employer] Information: No  [employer] of Residence:  Engineer, technical sales  Patient Main Form of Transportation: Uber/Lyft  Patient Strengths:  Strong; Like to dress  Patient Identified Areas of Improvement:  None  Patient Goal for Hospitalization:  "get out"  Current SI (including self-harm):  No  Current HI:  No  Current AVH: No  Staff Intervention Plan: Group Attendance, Collaborate with Interdisciplinary Treatment Team  Consent to Intern Participation: N/A    Caroll Rancher, LRT/CTRS  Caroll Rancher A 06/14/2018, 11:57 AM

## 2018-06-14 NOTE — Progress Notes (Signed)
D: Pt denies SI/HI/AVH. Pt is pleasant and cooperative. Pt stated she was feeling better, pt seen in the dayroom interacting at times.   A: Pt was offered support and encouragement. Pt was given scheduled medications. Pt was encourage to attend groups. Q 15 minute checks were done for safety.   R:Pt attends groups and interacts well with peers and staff. Pt is taking medication. Pt has no complaints.Pt receptive to treatment and safety maintained on unit.   Problem: Education: Goal: Emotional status will improve Outcome: Progressing   Problem: Education: Goal: Mental status will improve Outcome: Progressing

## 2018-06-15 LAB — PROLACTIN: Prolactin: 34.2 ng/mL — ABNORMAL HIGH (ref 4.8–23.3)

## 2018-06-15 NOTE — Progress Notes (Signed)
[place] NOVEL CORONAVIRUS (COVID-19) DAILY CHECK-OFF SYMPTOMS - answer yes or no to each - every day NO YES  Have you had a fever in the past 24 hours?  . Fever (Temp > 37.80C / 100F) X   Have you had any of these symptoms in the past 24 hours? . New Cough .  Sore Throat  .  Shortness of Breath .  Difficulty Breathing .  Unexplained Body Aches   X   Have you had any one of these symptoms in the past 24 hours not related to allergies?   . Runny Nose .  Nasal Congestion .  Sneezing   X   If you have had runny nose, nasal congestion, sneezing in the past 24 hours, has it worsened?  X   EXPOSURES - check yes or no X   Have you traveled outside the state in the past 14 days?  X   Have you been in contact with someone with a confirmed diagnosis of COVID-19 or PUI in the past 14 days without wearing appropriate PPE?  X   Have you been living in the same home as a person with confirmed diagnosis of COVID-19 or a PUI (household contact)?    X   Have you been diagnosed with COVID-19?    X              What to do next: Answered NO to all: Answered YES to anything:   Proceed with unit schedule Follow the [facility] Inpatient Flowsheet.   

## 2018-06-15 NOTE — Progress Notes (Addendum)
[facility] MD Progress Note  06/15/2018 9:44 AM Amanda Burnett  MRN:  [id number] Subjective:    Patient seen she did not attend groups this morning continues to be evasive and give me very meaningless answers focused on simply a discharge but denying all symptoms as result of her desire to leave. Denies auditory visual hallucinations Denies thoughts of harming self or others No EPS or TD Principal Problem: Chronic untreated psychosis Diagnosis: Active Problems:   Adjustment disorder   Paranoid schizophrenia (HCC)  Total Time spent with patient: 20 minutes  Past Medical History:  Past Medical History:  Diagnosis Date  . Anxiety   . Depression    History reviewed. No pertinent surgical history. Family History: History reviewed. No pertinent family history.  Social History:  Social History   Substance and Sexual Activity  Alcohol Use Yes   Comment: drinks 2 days a week, beer, wine & liquor, binge drinking     Social History   Substance and Sexual Activity  Drug Use Yes  . Types: Marijuana    Social History   Socioeconomic History  . Marital status: Single    Spouse name: Not on file  . Number of children: Not on file  . Years of education: Not on file  . Highest education level: Not on file  Occupational History  . Not on file  Social Needs  . Financial resource strain: Not on file  . Food insecurity:    Worry: Not on file    Inability: Not on file  . Transportation needs:    Medical: Not on file    Non-medical: Not on file  Tobacco Use  . Smoking status: Current Some Day Smoker  . Smokeless tobacco: Never Used  . Tobacco comment: socially  Substance and Sexual Activity  . Alcohol use: Yes    Comment: drinks 2 days a week, beer, wine & liquor, binge drinking  . Drug use: Yes    Types: Marijuana  . Sexual activity: Yes  Lifestyle  . Physical activity:    Days per week: Not on file    Minutes per session: Not on file  . Stress: Not on file  Relationships  .  Social connections:    Talks on phone: Not on file    Gets together: Not on file    Attends religious service: Not on file    Active member of club or organization: Not on file    Attends meetings of clubs or organizations: Not on file    Relationship status: Not on file  Other Topics Concern  . Not on file  Social History Narrative  . Not on file   Additional Social History:                         Sleep: Fair  Appetite:  Fair  Current Medications: Current Facility-Administered Medications  Medication Dose Route Frequency Provider Last Rate Last Dose  . acetaminophen (TYLENOL) tablet 650 mg  650 mg Oral Q6H PRN Denzil Magnuson, NP      . alum & mag hydroxide-simeth (MAALOX/MYLANTA) 200-200-20 MG/5ML suspension 30 mL  30 mL Oral Q4H PRN Denzil Magnuson, NP      . benztropine (COGENTIN) tablet 0.5 mg  0.5 mg Oral BID Malvin Johns, MD   0.5 mg at 06/15/18 0826  . magnesium hydroxide (MILK OF MAGNESIA) suspension 30 mL  30 mL Oral Daily PRN Denzil Magnuson, NP      . omega-3 acid ethyl  esters (LOVAZA) capsule 1 g  1 g Oral BID Malvin JohnsFarah, Crisanto Nied, MD   1 g at 06/15/18 [id number]  . risperiDONE (RISPERDAL) tablet 3 mg  3 mg Oral BID Malvin JohnsFarah, Harrol Novello, MD   3 mg at 06/15/18 [id number]  . temazepam (RESTORIL) capsule 30 mg  30 mg Oral QHS Malvin JohnsFarah, Rumor Sun, MD   30 mg at 06/14/18 2107    Lab Results:  Results for orders placed or performed during the hospital encounter of 06/13/18 (from the past 48 hour(s))  Hemoglobin A1c     Status: None   Collection Time: 06/14/18  7:01 AM  Result Value Ref Range   Hgb A1c MFr Bld 5.0 4.8 - 5.6 %    Comment: (NOTE) Pre diabetes:          5.7%-6.4% Diabetes:              >6.4% Glycemic control for   <7.0% adults with diabetes    Mean Plasma Glucose 96.8 mg/dL    Comment: Performed at [facility], [address] [place], [place] [address]  Lipid panel     Status: None   Collection Time: 06/14/18  7:01 AM  Result Value Ref Range   Cholesterol 174 0  - 200 mg/dL   Triglycerides 61 <562<150 mg/dL   HDL 85 >13>40 mg/dL   Total CHOL/HDL Ratio 2.0 RATIO   VLDL 12 0 - 40 mg/dL   LDL Cholesterol 77 0 - 99 mg/dL    Comment:        Total Cholesterol/HDL:CHD Risk Coronary Heart Disease Risk Table                     Men   Women  1/2 Average Risk   3.4   3.3  Average Risk       5.0   4.4  2 X Average Risk   9.6   7.1  3 X Average Risk  23.4   11.0        Use the calculated Patient Ratio above and the CHD Risk Table to determine the patient's CHD Risk.        ATP III CLASSIFICATION (LDL):  <100     mg/dL   Optimal  [id number]  mg/dL   Near or Above                    Optimal  130-159  mg/dL   Borderline  [id number]  mg/dL   High  >528>190     mg/dL   Very High Performed at [facility], [address] [place], [place] [address]   Prolactin     Status: Abnormal   Collection Time: 06/14/18  7:01 AM  Result Value Ref Range   Prolactin 34.2 (H) 4.8 - 23.3 ng/mL    Comment: (NOTE) Performed At: [facility] [address] [place], [place] [id number] Jolene SchimkeNagendra Sanjai MD [id number]   TSH     Status: None   Collection Time: 06/14/18  7:01 AM  Result Value Ref Range   TSH 1.463 0.350 - 4.500 uIU/mL    Comment: Performed by a 3rd Generation assay with a functional sensitivity of <=0.01 uIU/mL. Performed at [facility], [address] [place], [place] [address]     Blood Alcohol level:  Lab Results  Component Value Date   [facility] <10 06/12/2018   ETH <10 12/14/2016    Metabolic Disorder Labs: Lab Results  Component Value Date  HGBA1C 5.0 06/14/2018   MPG 96.8 06/14/2018   Lab Results  Component Value Date   PROLACTIN 34.2 (H) 06/14/2018   Lab Results  Component Value Date   CHOL 174 06/14/2018   TRIG 61 06/14/2018   HDL 85 06/14/2018   CHOLHDL 2.0 06/14/2018   VLDL 12 06/14/2018   LDLCALC 77 06/14/2018    Physical Findings: AIMS: Facial and Oral Movements Muscles of Facial  Expression: None, normal Lips and Perioral Area: None, normal Jaw: None, normal Tongue: None, normal,Extremity Movements Upper (arms, wrists, hands, fingers): None, normal Lower (legs, knees, ankles, toes): None, normal, Trunk Movements Neck, shoulders, hips: None, normal, Overall Severity Severity of abnormal movements (highest score from questions above): None, normal Incapacitation due to abnormal movements: None, normal Patient's awareness of abnormal movements (rate only patient's report): No Awareness, Dental Status Current problems with teeth and/or dentures?: No Does patient usually wear dentures?: No  CIWA:    COWS:     Musculoskeletal: Strength & Muscle Tone: within normal limits Gait & Station: normal Patient leans: N/A  Psychiatric Specialty Exam: Physical Exam  ROS  Blood pressure 109/82, pulse 96, temperature 98.1 F (36.7 C), resp. rate 18, height 5\' 6"  (1.676 m), weight 57.6 kg, SpO2 100 %.Body mass index is 20.5 kg/m.  General Appearance: Casual  Eye Contact:  Fair  Speech:  Clear and Coherent  Volume:  Decreased  Mood:  Dysphoric  Affect:  Appropriate  Thought Process:  Disorganized and Irrelevant  Orientation:  Full (Time, Place, and Person)  Thought Content:  Illogical and Tangential  Suicidal Thoughts:  No  Homicidal Thoughts:  No  Memory:  Immediate;   Fair  Judgement:  Impaired  Insight:  Lacking  Psychomotor Activity:  Decreased  Concentration:  Concentration: Poor  Recall:  [employer] of Knowledge:  Fair  Language:  Fair  Akathisia:  Negative  Handed:  Right  AIMS (if indicated):     Assets:  Resilience Social Support  ADL's:  Intact  Cognition:  WNL  Sleep:  Number of Hours: 6.5     Treatment Plan Summary: Daily contact with patient to assess and evaluate symptoms and progress in treatment, Medication management and Plan Continue current meds and cognitive therapy minimizes everything so is difficult to discern just the exact status  but meeting with social work now hopefully more information will be forthcoming.  Continue Risperdal and reality based therapy  Chaska Hagger, MD 06/15/2018, 9:44 AM

## 2018-06-15 NOTE — Progress Notes (Signed)
DAR NOTE: Patient presents with anxious affect and mood.  Denies suicidal thoughts,pain, auditory and visual hallucinations.  Rates depression at 0, hopelessness at 0, and anxiety at 0.  Maintained on routine safety checks.  Refused Risperdal and Cogentin at 5 pm for complain of drowsiness.  Support and encouragement offered as needed.  Attended group and participated.  Patient visible in milieu listening to music.  Patient is safe on the unit.

## 2018-06-15 NOTE — Progress Notes (Signed)
Nursing Progress Note: 7p-7a D: Pt currently presents with a anxious/animated affect and behavior. Interacting appropriately with the milieu. Pt reports good sleep during the previous night with current medication regimen.  A: Pt refused medications per providers orders. Pt's labs and vitals were monitored throughout the night. Pt supported emotionally and encouraged to express concerns and questions. Pt educated on medications.  R: Pt's safety ensured with 15 minute and environmental checks. Pt currently denies SI, HI, and AVH. Pt verbally contracts to seek staff if SI,HI, or AVH occurs and to consult with staff before acting on any harmful thoughts. Will continue to monitor.
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
## 2018-06-15 NOTE — Progress Notes (Signed)
Recreation Therapy Notes  Date: 4.30.20 Time:  1000 Location: 500 Hall Dayroom  Group Topic: Communication, Team Building, Problem Solving  Goal Area(s) Addresses:  Patient will effectively work with peer towards shared goal.  Patient will identify skills used to make activity successful.  Patient will identify how skills used during activity can be used to reach post d/c goals.   Behavioral Response: Engaged  Intervention: STEM Activity  Activity: Stage manager. In teams patients were given 12 plastic drinking straws and Burnett length of masking tape. Using the materials provided patients were asked to build Burnett landing pad to catch Burnett golf ball dropped from approximately 6 feet in the air.   Education: Pharmacist, community, Discharge Planning   Education Outcome: Acknowledges education/In group clarification offered/Needs additional education.   Clinical Observations/Feedback: Pt was active but quiet.  Pt engaged when prompted.  Pt worked well with her partner.  Pt was pleasant throughout group session.    Amanda Burnett, LRT/CTRS      Amanda Burnett, Amanda Burnett 06/15/2018 11:06 AM

## 2018-06-15 NOTE — Progress Notes (Signed)
Pt did not attend orientation/goals group this morning.   

## 2018-06-15 NOTE — [facility] Group Notes (Signed)
[facility] LCSW Group Therapy Note  Date/Time: 06/04/28, 1315  Type of Therapy/Topic:  Group Therapy:  Balance in Life  Participation Level:  moderate  Description of Group:    This group will address the concept of balance and how it feels and looks when one is unbalanced. Patients will be encouraged to process areas in their lives that are out of balance, and identify reasons for remaining unbalanced. Facilitators will guide patients utilizing problem- solving interventions to address and correct the stressor making their life unbalanced. Understanding and applying boundaries will be explored and addressed for obtaining  and maintaining a balanced life. Patients will be encouraged to explore ways to assertively make their unbalanced needs known to significant others in their lives, using other group members and facilitator for support and feedback.  Therapeutic Goals: 1. Patient will identify two or more emotions or situations they have that consume much of in their lives. 2. Patient will identify signs/triggers that life has become out of balance:  3. Patient will identify two ways to set boundaries in order to achieve balance in their lives:  4. Patient will demonstrate ability to communicate their needs through discussion and/or role plays  Summary of Patient Progress:Pt mostly quiet in group today but did respond to CSW questions.  Pt identified mental/emotional, family, and financial as areas of her life that are out of balance, but then clarified that her family believes her mental health is poor, but she feels like she is fine.  Pt appropriate and attentive throughout.           Therapeutic Modalities:   Cognitive Behavioral Therapy Solution-Focused Therapy Assertiveness Training  Daleen Squibb, [place]

## 2018-06-16 MED ORDER — ARIPIPRAZOLE 2 MG PO TABS
2.0000 mg | ORAL_TABLET | Freq: Once | ORAL | Status: DC
  Filled 2018-06-16: qty 1

## 2018-06-16 NOTE — Progress Notes (Addendum)
Recreation Therapy Notes  Date: 5.1.20 Time: 1000 Location: 500 Hall Dayroom  Group Topic: Communication  Goal Area(s) Addresses:  Patient will effectively communicate with peers in group.  Patient will verbalize benefit of healthy communication. Patient will verbalize positive effect of healthy communication on post d/c goals.  Patient will identify communication techniques that made activity effective for group.   Behavioral Response: Engaged  Intervention:  Plain paper, pencils, drawings   Activity: Geometrical Drawings.  Patients took turns describing a geometrical drawing to the group.  The speaker could describe the picture in as much detail as needed.  The patients who were drawing could only ask the speaker to repeat themselves.  They could not ask any detailed questions.  At the completion of each drawing, patients would compare their drawing to the original picture.  Education: Communication, Discharge Planning  Education Outcome: Acknowledges understanding/In group clarification offered/Needs additional education.   Clinical Observations/Feedback:  Pt was active and quiet.  Pt did describe one of the drawings to peers.  Pt felt she did a good job describing the picture.  Peers expressed she explained everything and answered them when asked to repeat herself.  Pt talked about how someone not responding when speaken to could be mistaken as that person not caring about what the person is talking to them about.    Caroll Rancher, LRT/CTRS      Caroll Rancher A 06/16/2018 12:19 PM

## 2018-06-16 NOTE — Progress Notes (Signed)
[place] NOVEL CORONAVIRUS (COVID-19) DAILY CHECK-OFF SYMPTOMS - answer yes or no to each - every day NO YES  Have you had a fever in the past 24 hours?  . Fever (Temp > 37.80C / 100F) X   Have you had any of these symptoms in the past 24 hours? . New Cough .  Sore Throat  .  Shortness of Breath .  Difficulty Breathing .  Unexplained Body Aches   X   Have you had any one of these symptoms in the past 24 hours not related to allergies?   . Runny Nose .  Nasal Congestion .  Sneezing   X   If you have had runny nose, nasal congestion, sneezing in the past 24 hours, has it worsened?  X   EXPOSURES - check yes or no X   Have you traveled outside the state in the past 14 days?  X   Have you been in contact with someone with a confirmed diagnosis of COVID-19 or PUI in the past 14 days without wearing appropriate PPE?  X   Have you been living in the same home as a person with confirmed diagnosis of COVID-19 or a PUI (household contact)?    X   Have you been diagnosed with COVID-19?    X              What to do next: Answered NO to all: Answered YES to anything:   Proceed with unit schedule Follow the [facility] Inpatient Flowsheet.   

## 2018-06-16 NOTE — [facility] Group Notes (Signed)
  [facility] LCSW Group Therapy Note  Date/Time: 06/16/18, 1310  Type of Therapy/Topic:  Group Therapy:  Emotion Regulation  Participation Level:  Active   Mood: pleasant  Description of Group:    The purpose of this group is to assist patients in learning to regulate negative emotions and experience positive emotions. Patients will be guided to discuss ways in which they have been vulnerable to their negative emotions. These vulnerabilities will be juxtaposed with experiences of positive emotions or situations, and patients challenged to use positive emotions to combat negative ones. Special emphasis will be placed on coping with negative emotions in conflict situations, and patients will process healthy conflict resolution skills.  Therapeutic Goals: 1. Patient will identify two positive emotions or experiences to reflect on in order to balance out negative emotions:  2. Patient will label two or more emotions that they find the most difficult to experience:  3. Patient will be able to demonstrate positive conflict resolution skills through discussion or role plays:   Summary of Patient Progress:Pt attentive, not real talkative in group.  Pt said that happiness is the hardest emotion for her because she "trusts the wrong people" when she is happy and makes bad decisions.  Pt did not offer up comments during discussion but she did respond to CSW questions and was appropriate throughout.       Therapeutic Modalities:   Cognitive Behavioral Therapy Feelings Identification Dialectical Behavioral Therapy  Daleen Squibb, LCSW

## 2018-06-16 NOTE — [facility] Suicide Risk Assessment (Signed)
[facility] INPATIENT:  Family/Significant Other Suicide Prevention Education  Suicide Prevention Education:  Education Completed; Pt's mother, Amanda Burnett ([phone]), has been identified by the patient as the family member/significant other with whom the patient will be residing, and identified as the person(s) who will aid the patient in the event of a mental health crisis (suicidal ideations/suicide attempt).  With written consent from the patient, the family member/significant other has been provided the following suicide prevention education, prior to the and/or following the discharge of the patient.  The suicide prevention education provided includes the following:  Suicide risk factors  Suicide prevention and interventions  National Suicide Hotline telephone number  [facility] assessment telephone number  [facility] Emergency Assistance 911  [facility] and/or Residential Mobile Crisis Unit telephone number  Request made of family/significant other to:  Remove weapons (e.g., guns, rifles, knives), all items previously/currently identified as safety concern.    Remove drugs/medications (over-the-counter, prescriptions, illicit drugs), all items previously/currently identified as a safety concern.  The family member/significant other verbalizes understanding of the suicide prevention education information provided.  The family member/significant other agrees to remove the items of safety concern listed above.  CSW contacted pt's mother, Amanda Burnett. Pt's mother reports that the patient called her about an hour ago and was upset that she was still in the hospital. Pt's mother states that the patient is blaming her for being in the hospital. Pt's mother reports feeling like her daughter has not changed. Pt's mother reports that she is not going to take the pt back into her home. Pt's mother reports that someone needs to let the patient know and that she will need  to find somewhere else to stay. Pt's mother feels that the patient will not take her medications when she gets home and she will not do outpatient. Pt's mother reports that this has happened before and that the patient refuses to think anything is wrong. Pt's mother reports that the patient stated that people on her hall actually have issues and she does not. Pt's mother reports that she has not lived on her own for the past two years. Pt will go stay with friends, have an episode, and then they make her leave.   Amanda Burnett 06/16/2018, 2:51 PM

## 2018-06-16 NOTE — Plan of Care (Signed)
Progress note  D: pt found in bed; compliant with medication education. Pt denied all medication besides her vitamins. Pt states she doesn't need them and should be here. Pt denies any physical complaints or pain. Pt has been visible in the dayroom but not interacting. Pt denies si/hi/ah/vh and verbally agrees to approach staff if these become apparent or before harming herself/others while at [facility]. A: pt provided support and encouragement. Pt given medication per protocol and standing orders. Q56m safety checks implemented and continued.  R: pt safe on the unit. Will continue to monitor.   Pt progressing in the following metrics  Problem: Education: Goal: Knowledge of [facility] Education information/materials will improve Outcome: Progressing   Problem: Activity: Goal: Interest or engagement in activities will improve Outcome: Progressing Goal: Sleeping patterns will improve Outcome: Progressing   Problem: Health Behavior/Discharge Planning: Goal: Identification of resources available to assist in meeting health care needs will improve Outcome: Progressing

## 2018-06-16 NOTE — Progress Notes (Signed)
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 Anxiety   . Depression    History reviewed. No pertinent surgical history. Family History: History reviewed. No pertinent family history.  Social History:  Social History   Substance and Sexual Activity  Alcohol Use Yes   Comment: drinks 2 days a week, beer, wine & liquor, binge drinking     Social History   Substance and Sexual Activity  Drug Use Yes  . Types: Marijuana    Social History   Socioeconomic History  . Marital status: Single    Spouse name: Not on file  . Number of children: Not on file  . Years of education: Not on file  . Highest education level: Not on file  Occupational History  . Not on file  Social Needs  . Financial resource strain: Not on file  . Food insecurity:    Worry: Not on file    Inability: Not on file  . Transportation needs:    Medical: Not on file    Non-medical: Not on file  Tobacco Use  . Smoking status: Current Some Day Smoker  . Smokeless tobacco: Never Used  . Tobacco comment: socially  Substance and Sexual Activity  . Alcohol use: Yes    Comment: drinks 2 days a week, beer, wine & liquor, binge drinking  . Drug use: Yes    Types: Marijuana  . Sexual activity: Yes  Lifestyle  . Physical activity:    Days per week: Not on file    Minutes per session: Not on file  . Stress: Not on file  Relationships  .  Social connections:    Talks on phone: Not on file    Gets together: Not on file    Attends religious service: Not on file    Active member of club or organization: Not on file    Attends meetings of clubs or organizations: Not on file    Relationship status: Not on file  Other Topics Concern  . Not on file  Social History Narrative  . Not on file   Additional Social History:                         Sleep: Fair  Appetite:  Fair  Current Medications: Current Facility-Administered Medications  Medication Dose Route Frequency Provider Last Rate Last Dose  . acetaminophen (TYLENOL) tablet 650 mg  650 mg Oral Q6H PRN Denzil Magnuson, NP      . alum & mag hydroxide-simeth (MAALOX/MYLANTA) 200-200-20 MG/5ML suspension 30 mL  30 mL Oral Q4H PRN Denzil Magnuson, NP      . benztropine (COGENTIN) tablet 0.5 mg  0.5 mg Oral BID Malvin Johns, MD   0.5 mg at 06/15/18 0826  . magnesium hydroxide (MILK OF MAGNESIA) suspension 30 mL  30 mL Oral Daily PRN Denzil Magnuson, NP      . omega-3 acid ethyl esters (  LOVAZA) capsule 1 g  1 g Oral BID Malvin JohnsFarah, Tyrez Berrios, MD   1 g at 06/16/18 [id number]  . risperiDONE (RISPERDAL) tablet 3 mg  3 mg Oral BID Malvin JohnsFarah, Caragh Gasper, MD   3 mg at 06/15/18 [id number]  . temazepam (RESTORIL) capsule 30 mg  30 mg Oral QHS Malvin JohnsFarah, Britni Driscoll, MD   30 mg at 06/14/18 2107    Lab Results: No results found for this or any previous visit (from the past 48 hour(s)).  Blood Alcohol level:  Lab Results  Component Value Date   ETH <10 06/12/2018   ETH <10 12/14/2016    Metabolic Disorder Labs: Lab Results  Component Value Date   HGBA1C 5.0 06/14/2018   MPG 96.8 06/14/2018   Lab Results  Component Value Date   PROLACTIN 34.2 (H) 06/14/2018   Lab Results  Component Value Date   CHOL 174 06/14/2018   TRIG 61 06/14/2018   HDL 85 06/14/2018   CHOLHDL 2.0 06/14/2018   VLDL 12 06/14/2018   LDLCALC 77 06/14/2018    Physical Findings: AIMS: Facial and Oral Movements Muscles of  Facial Expression: None, normal Lips and Perioral Area: None, normal Jaw: None, normal Tongue: None, normal,Extremity Movements Upper (arms, wrists, hands, fingers): None, normal Lower (legs, knees, ankles, toes): None, normal, Trunk Movements Neck, shoulders, hips: None, normal, Overall Severity Severity of abnormal movements (highest score from questions above): None, normal Incapacitation due to abnormal movements: None, normal Patient's awareness of abnormal movements (rate only patient's report): No Awareness, Dental Status Current problems with teeth and/or dentures?: No Does patient usually wear dentures?: No  CIWA:    COWS:     Musculoskeletal: Strength & Muscle Tone: within normal limits Gait & Station: normal Patient leans: N/A  Psychiatric Specialty Exam: Physical Exam  ROS  Blood pressure (!) 123/92, pulse 97, temperature 97.8 F (36.6 C), resp. rate 18, height 5\' 6"  (1.676 m), weight 57.6 kg, SpO2 100 %.Body mass index is 20.5 kg/m.  General Appearance: Casual  Eye Contact:  Good  Speech:  Clear and Coherent  Volume:  Normal  Mood:  Euthymic  Affect:  Congruent and Full Range  Thought Process:  Irrelevant and Descriptions of Associations: Loose  Orientation:  Full (Time, Place, and Person)  Thought Content:  Illogical, Delusions and Tangential  Suicidal Thoughts:  No  Homicidal Thoughts:  No  Memory:  Immediate;   Poor  Judgement:  Impaired  Insight:  Lacking  Psychomotor Activity:  Normal  Concentration:  Concentration: Poor  Recall:  Poor  Fund of Knowledge:  Fair  Language:  Fair  Akathisia:  Negative  Handed:  Right  AIMS (if indicated):     Assets:  Resilience  ADL's:  Intact  Cognition:  WNL  Sleep:  Number of Hours: 6.5     Treatment Plan Summary: Daily contact with patient to assess and evaluate symptoms and progress in treatment, Medication management and Plan Continue to encourage long-acting injectable and this will be the barrier to  discharge his acceptance of medications and illness  Marcella Charlson, MD 06/16/2018, 12:09 PM

## 2018-06-16 NOTE — Progress Notes (Signed)
Nursing Progress Note: 7p-7a D: Pt currently presents with a anxious/animated/pleasant affect and behavior. Interacting appropriately with the milieu. Pt reports good sleep during the previous night with current medication regimen.  A: Pt refused medications per providers orders. Pt's labs and vitals were monitored throughout the night. Pt supported emotionally and encouraged to express concerns and questions. Pt educated on medications.  R: Pt's safety ensured with 15 minute and environmental checks. Pt currently denies SI, HI, and AVH. Pt verbally contracts to seek staff if SI,HI, or AVH occurs and to consult with staff before acting on any harmful thoughts. Will continue to monitor.

## 2018-06-17 DIAGNOSIS — F4320 Adjustment disorder, unspecified: Secondary | ICD-10-CM

## 2018-06-17 MED ORDER — LORAZEPAM 1 MG PO TABS: 1.0000 mg | ORAL_TABLET | Freq: Four times a day (QID) | ORAL | Status: DC | PRN

## 2018-06-17 MED ORDER — LORAZEPAM 2 MG/ML IJ SOLN: 1.0000 mg | Freq: Four times a day (QID) | INTRAMUSCULAR | Status: DC | PRN
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
## 2018-06-17 NOTE — Progress Notes (Signed)
[place] NOVEL CORONAVIRUS (COVID-19) DAILY CHECK-OFF SYMPTOMS - answer yes or no to each - every day NO YES  Have you had a fever in the past 24 hours?  . Fever (Temp > 37.80C / 100F) X   Have you had any of these symptoms in the past 24 hours? . New Cough .  Sore Throat  .  Shortness of Breath .  Difficulty Breathing .  Unexplained Body Aches   X   Have you had any one of these symptoms in the past 24 hours not related to allergies?   . Runny Nose .  Nasal Congestion .  Sneezing   X   If you have had runny nose, nasal congestion, sneezing in the past 24 hours, has it worsened?  X   EXPOSURES - check yes or no X   Have you traveled outside the state in the past 14 days?  X   Have you been in contact with someone with a confirmed diagnosis of COVID-19 or PUI in the past 14 days without wearing appropriate PPE?  X   Have you been living in the same home as a person with confirmed diagnosis of COVID-19 or a PUI (household contact)?    X   Have you been diagnosed with COVID-19?    X              What to do next: Answered NO to all: Answered YES to anything:   Proceed with unit schedule Follow the [facility] Inpatient Flowsheet.   
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
## 2018-06-17 NOTE — Progress Notes (Signed)
Pt signed a 72 hour request for discharge form on 06/17/2018 at 0905 am. See pt's chart for document.

## 2018-06-17 NOTE — [facility] Group Notes (Signed)
  [facility] LCSW Group Therapy Note  Date/Time:  06/17/2018 11:15AM-12:00PM  Type of Therapy and Topic:  Group Therapy:  Feelings About Hospitalization  Participation Level:  Active   Description of Group This process group involved patients discussing their feelings related to being hospitalized, as well as the benefits they see to being in the hospital.  These feelings and benefits were itemized.  The group then brainstormed specific ways in which they could seek those same benefits when they discharge and return home.  Therapeutic Goals 1. Patient will identify and describe positive and negative feelings related to hospitalization 2. Patient will verbalize benefits of hospitalization to themselves personally 3. Patients will brainstorm together ways they can obtain similar benefits in the outpatient setting, identify barriers to wellness and possible solutions  Summary of Patient Progress:  The patient expressed initially that she had "nothing to say" about her primary feelings about being hospitalized.  Later, she became animated and loud with another patient, speaking in grandiose and tangential terms.  They became increasingly excited and agitated until CSW terminated group early.  During the session, patient challenged CSW, stating that people come to the hospital and are told staff will "cure" them, and we do not do so, therefore we do not help them.  She became upset about another patient being drowsy, saying "There is no humanity in the world."  She demanded that CSW should wake up the patient, and take him immediately to the doctor to tell him the medicines are not working.  She had poor insight and seemed quite manic.  Therapeutic Modalities Cognitive Behavioral Therapy Motivational Interviewing    Ambrose Mantle, LCSW 06/17/2018, 9:17 AM  ;l

## 2018-06-17 NOTE — Progress Notes (Signed)
Nursing Progress Note: 7p-7a D: Pt currently presents with a pleasant/appropriate affect and behavior. Pt states "I don't need medicine. I'm fine." Interacting appropriately with the milieu. Pt reports good sleep during the previous night with current medication regimen.   A: Pt provided with medications per providers orders. Pt's labs and vitals were monitored throughout the night. Pt supported emotionally and encouraged to express concerns and questions. Pt educated on medications.  R: Pt's safety ensured with 15 minute and environmental checks. Pt currently denies SI, HI, and AVH. Pt verbally contracts to seek staff if SI,HI, or AVH occurs and to consult with staff before acting on any harmful thoughts. Will continue to monitor.

## 2018-06-17 NOTE — Progress Notes (Signed)
The focus of this group is to help patients establish daily goals to achieve during treatment and discuss how the patient can incorporate goal setting into their daily lives to aide in recovery. 

## 2018-06-18 NOTE — Progress Notes (Addendum)
[facility] MD Progress Note  06/18/2018 9:52 AM Amanda Burnett  MRN:  [id number]   Evaluation : Amanda Burnett observed sitting in day room interacting with peers.  She continues to request to be discharged.  She denies suicidal or homicidal ideation.  Denies auditory visual hallucination.  However patient observed responding to internal stimuli.  Mild thought blocking noted. patient continues to refuse medications as she reports she does not need any at this time.  Reports a good appetite.  States she is resting well throughout the night.  Patient signed 72-hour request to discharge on 5/2.  Patient encouraged to start medication however declined.  Support encouragement,reassurance was provided.   History: Per admission assessment note- this is the first psychiatric admission here or elsewhere for Amanda Burnett a 33 year old unemployed individual who lives with her mother, who has had at least 2 years of psychotic symptoms but no formal treatment.  she was evaluated in October 2018 by our staff through the emergency department and though she endorsed psychotic symptoms she displayed no acute dangerousness and was referred for outpatient follow-up but she did not follow-up as directed.    Principal Problem: Chronic untreated psychosis Diagnosis: Active Problems:   Adjustment disorder   Paranoid schizophrenia (HCC)  Total Time spent with patient: 20 minutes  Past Medical History:  Past Medical History:  Diagnosis Date  . Anxiety   . Depression    History reviewed. No pertinent surgical history. Family History: History reviewed. No pertinent family history.  Social History:  Social History   Substance and Sexual Activity  Alcohol Use Yes   Comment: drinks 2 days a week, beer, wine & liquor, binge drinking     Social History   Substance and Sexual Activity  Drug Use Yes  . Types: Marijuana    Social History   Socioeconomic History  . Marital status: Single    Spouse name: Not on file  .  Number of children: Not on file  . Years of education: Not on file  . Highest education level: Not on file  Occupational History  . Not on file  Social Needs  . Financial resource strain: Not on file  . Food insecurity:    Worry: Not on file    Inability: Not on file  . Transportation needs:    Medical: Not on file    Non-medical: Not on file  Tobacco Use  . Smoking status: Current Some Day Smoker  . Smokeless tobacco: Never Used  . Tobacco comment: socially  Substance and Sexual Activity  . Alcohol use: Yes    Comment: drinks 2 days a week, beer, wine & liquor, binge drinking  . Drug use: Yes    Types: Marijuana  . Sexual activity: Yes  Lifestyle  . Physical activity:    Days per week: Not on file    Minutes per session: Not on file  . Stress: Not on file  Relationships  . Social connections:    Talks on phone: Not on file    Gets together: Not on file    Attends religious service: Not on file    Active member of club or organization: Not on file    Attends meetings of clubs or organizations: Not on file    Relationship status: Not on file  Other Topics Concern  . Not on file  Social History Narrative  . Not on file   Additional Social History:  Sleep: Fair  Appetite:  Fair  Current Medications: Current Facility-Administered Medications  Medication Dose Route Frequency Provider Last Rate Last Dose  . acetaminophen (TYLENOL) tablet 650 mg  650 mg Oral Q6H PRN Denzil Magnusonhomas, Lashunda, NP      . alum & mag hydroxide-simeth (MAALOX/MYLANTA) 200-200-20 MG/5ML suspension 30 mL  30 mL Oral Q4H PRN Denzil Magnusonhomas, Lashunda, NP      . ARIPiprazole (ABILIFY) tablet 2 mg  2 mg Oral Once Malvin JohnsFarah, Brian, MD      . benztropine (COGENTIN) tablet 0.5 mg  0.5 mg Oral BID Malvin JohnsFarah, Brian, MD   0.5 mg at 06/15/18 [id number]  . LORazepam (ATIVAN) tablet 1 mg  1 mg Oral Q6H PRN Oneta RackLewis, Tanika N, NP       Or  . LORazepam (ATIVAN) injection 1 mg  1 mg Intramuscular Q6H PRN  Oneta RackLewis, Tanika N, NP      . magnesium hydroxide (MILK OF MAGNESIA) suspension 30 mL  30 mL Oral Daily PRN Denzil Magnusonhomas, Lashunda, NP      . omega-3 acid ethyl esters (LOVAZA) capsule 1 g  1 g Oral BID Malvin JohnsFarah, Brian, MD   1 g at 06/16/18 [id number]  . risperiDONE (RISPERDAL) tablet 3 mg  3 mg Oral BID Malvin JohnsFarah, Brian, MD   3 mg at 06/15/18 [id number]  . temazepam (RESTORIL) capsule 30 mg  30 mg Oral QHS Malvin JohnsFarah, Brian, MD   30 mg at 06/14/18 2107    Lab Results: No results found for this or any previous visit (from the past 48 hour(s)).  Blood Alcohol level:  Lab Results  Component Value Date   ETH <10 06/12/2018   ETH <10 12/14/2016    Metabolic Disorder Labs: Lab Results  Component Value Date   HGBA1C 5.0 06/14/2018   MPG 96.8 06/14/2018   Lab Results  Component Value Date   PROLACTIN 34.2 (H) 06/14/2018   Lab Results  Component Value Date   CHOL 174 06/14/2018   TRIG 61 06/14/2018   HDL 85 06/14/2018   CHOLHDL 2.0 06/14/2018   VLDL 12 06/14/2018   LDLCALC 77 06/14/2018    Physical Findings: AIMS: Facial and Oral Movements Muscles of Facial Expression: None, normal Lips and Perioral Area: None, normal Jaw: None, normal Tongue: None, normal,Extremity Movements Upper (arms, wrists, hands, fingers): None, normal Lower (legs, knees, ankles, toes): None, normal, Trunk Movements Neck, shoulders, hips: None, normal, Overall Severity Severity of abnormal movements (highest score from questions above): None, normal Incapacitation due to abnormal movements: None, normal Patient's awareness of abnormal movements (rate only patient's report): No Awareness, Dental Status Current problems with teeth and/or dentures?: No Does patient usually wear dentures?: No  CIWA:    COWS:     Musculoskeletal: Strength & Muscle Tone: within normal limits Gait & Station: normal Patient leans: N/A  Psychiatric Specialty Exam: Physical Exam  Vitals reviewed. Constitutional: She appears well-developed.   Psychiatric: She has a normal mood and affect. Her behavior is normal.    Review of Systems  Psychiatric/Behavioral: Positive for depression. Negative for hallucinations and suicidal ideas. The patient is nervous/anxious.   All other systems reviewed and are negative.   Blood pressure 120/88, pulse 82, temperature 98.4 F (36.9 C), temperature source Oral, resp. rate 18, height 5\' 6"  (1.676 m), weight 57.6 kg, SpO2 100 %.Body mass index is 20.5 kg/m.  General Appearance: Casual  Eye Contact:  Good  Speech:  Clear and Coherent  Volume:  Normal  Mood:  Euthymic  Affect:  Congruent and  Full Range  Thought Process:  Irrelevant and Descriptions of Associations: Loose  Orientation:  Full (Time, Place, and Person)  Thought Content:  Illogical, Delusions and Paranoid Ideation  Suicidal Thoughts:  No  Homicidal Thoughts:  No  Memory:  Immediate;   Poor  Judgement:  Impaired  Insight:  Lacking  Psychomotor Activity:  Normal  Concentration:  Concentration: Poor  Recall:  Poor  Fund of Knowledge:  Fair  Language:  Fair  Akathisia:  Negative  Handed:  Right  AIMS (if indicated):     Assets:  Resilience  ADL's:  Intact  Cognition:  WNL  Sleep:  Number of Hours: 5.5     Treatment Plan Summary: Daily contact with patient to assess and evaluate symptoms and progress in treatment and Medication management   Continue with current treatment plan on 06/18/2018 as listed below except were noted  Adjustment disorder:  paranoid schizophrenia:  Continue to encourage medications Abilify 2 mg p.o. x1 dose -Consider monthly injection of Abilify Maintena Risperdal 3 mg p.o. twice daily Restoril 30 mg p.o. nightly  Staff report patient continues to refuse all medication  CSW to continue working on discharge disposition Patient encouraged to participate within the therapeutic milieu.   Oneta Rack, NP 06/18/2018, 9:52 AM  Attest to NP Progress Note

## 2018-06-18 NOTE — Progress Notes (Signed)
DAR NOTE: Pt present with flat affect and depressed mood in the unit. Pt has been in the day room most of the time. Pt observed responding to . Pt refused internal stimuli. Pt refused her medications stating she does not not need them. Pt is not interacting, her moods changes rapidly from being friendly and calm to being labile, irritable and agitated. Pt denies physical pain. As per self inventory, pt had a good night sleep, good appetite, normal energy, and good concentration. Pt rate depression at 0, hopeless ness at 0, and anxiety at 0. Pt's safety ensured with 15 minute and environmental checks. Pt currently denies SI/HI and A/V hallucinations. Pt verbally agrees to seek staff if SI/HI or A/VH occurs and to consult with staff before acting on these thoughts. Will continue POC.

## 2018-06-18 NOTE — [facility] Group Notes (Signed)
[facility] LCSW Group Therapy Note  Date/Time:  06/18/2018  11:00AM-12:00PM  Type of Therapy and Topic:  Group Therapy:  Music and Mood  Participation Level:  Minimal   Description of Group: In this process group, members listened to a variety of genres of music and identified that different types of music evoke different responses.  Patients were encouraged to identify music that was soothing for them and music that was energizing for them.  Patients discussed how this knowledge can help with wellness and recovery in various ways including managing depression and anxiety as well as encouraging healthy sleep habits.    Therapeutic Goals: 1. Patients will explore the impact of different varieties of music on mood 2. Patients will verbalize the thoughts they have when listening to different types of music 3. Patients will identify music that is soothing to them as well as music that is energizing to them 4. Patients will discuss how to use this knowledge to assist in maintaining wellness and recovery 5. Patients will explore the use of music as a coping skill  Summary of Patient Progress:  At the beginning of group, patient expressed that she felt "good" and at the end of group said she felt the same.  Throughout group, she sat shaking her head.  Therapeutic Modalities: Solution Focused Brief Therapy Activity   Ambrose Mantle, LCSW

## 2018-06-19 MED ORDER — BENZTROPINE MESYLATE 0.5 MG PO TABS: 0.5000 mg | ORAL_TABLET | Freq: Two times a day (BID) | ORAL | 2 refills | Status: AC

## 2018-06-19 MED ORDER — ARIPIPRAZOLE ER 400 MG IM SRER
400.0000 mg | INTRAMUSCULAR | Status: DC
  Administered 2018-06-19: 400 mg via INTRAMUSCULAR

## 2018-06-19 MED ORDER — ARIPIPRAZOLE ER 400 MG IM SRER: 400.0000 mg | INTRAMUSCULAR | 11 refills | Status: AC

## 2018-06-19 MED ORDER — RISPERIDONE 3 MG PO TABS: 3.0000 mg | ORAL_TABLET | Freq: Two times a day (BID) | ORAL | 1 refills | Status: AC

## 2018-06-19 NOTE — Progress Notes (Signed)
  [facility] Adult Case Management Discharge Plan :  Will you be returning to the same living situation after discharge:  Yes,  with coach At discharge, do you have transportation home?: Yes,  friend Do you have the ability to pay for your medications: No. Will work with [employer].  Release of information consent forms completed and in the chart;  Patient's signature needed at discharge.  Patient to Follow up at: Follow-up Information    [facility] Follow up.   Why:  Lawyer will contact you with a hospital follow up appointment.  Contact information: [address] [place] [address] [phone]           Next level of care provider has access to [facility] Link:no  Safety Planning and Suicide Prevention discussed: Yes,  mother  Have you used any form of tobacco in the last 30 days? (Cigarettes, Smokeless Tobacco, Cigars, and/or Pipes): No("I smoke weed sometimes, I last smoked 5 weeks ago".)  Has patient been referred to the Quitline?: N/A patient is not a smoker  Patient has been referred for addiction treatment: No, pt refused referral.   Lorri Frederick, LCSW 06/19/2018, 3:58 PM
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
## 2018-06-19 NOTE — Progress Notes (Signed)
Recreation Therapy Notes  INPATIENT RECREATION TR PLAN  Patient Details Name: Amanda Burnett MRN: [id number] DOB: Nov 12, 1985 Today's Date: 06/19/2018  Rec Therapy Plan Is patient appropriate for Therapeutic Recreation?: Yes Treatment times per week: about 3 days Estimated Length of Stay: 5-7 days TR Treatment/Interventions: Group participation (Comment)  Discharge Criteria Pt will be discharged from therapy if:: Discharged Treatment plan/goals/alternatives discussed and agreed upon by:: Patient/family  Discharge Summary Short term goals set: See patient care plan Short term goals met: Complete Progress toward goals comments: Groups attended Which groups?: Wellness, Communication, Other (Comment)(Team building) Reason goals not met: None Therapeutic equipment acquired: N/A Reason patient discharged from therapy: Discharge from hospital Pt/family agrees with progress & goals achieved: Yes Date patient discharged from therapy: 06/19/18    Victorino Sparrow, LRT/CTRS   Ria Comment, [place] 06/19/2018, 11:54 AM

## 2018-06-19 NOTE — Plan of Care (Signed)
Discharge note  Patient verbalizes readiness for discharge. Follow up plan explained, AVS, Transition record and SRA given. Prescriptions and teaching provided. Belongings returned and signed for. Suicide safety plan completed and signed. Patient verbalizes understanding. Patient denies SI/HI and assures this writer she will seek assistance should that change. Patient discharged to lobby where family was waiting.  Problem: Education: Goal: Knowledge of [place] [facility] Education information/materials will improve Outcome: Adequate for Discharge Goal: Emotional status will improve Outcome: Adequate for Discharge Goal: Mental status will improve Outcome: Adequate for Discharge Goal: Verbalization of understanding the information provided will improve Outcome: Adequate for Discharge   Problem: Activity: Goal: Interest or engagement in activities will improve Outcome: Adequate for Discharge Goal: Sleeping patterns will improve Outcome: Adequate for Discharge   Problem: Health Behavior/Discharge Planning: Goal: Identification of resources available to assist in meeting health care needs will improve Outcome: Adequate for Discharge Goal: Compliance with treatment plan for underlying cause of condition will improve Outcome: Adequate for Discharge   Problem: Safety: Goal: Periods of time without injury will increase Outcome: Adequate for Discharge   Problem: Education: Goal: Ability to state activities that reduce stress will improve Outcome: Adequate for Discharge   Problem: Self-Concept: Goal: Ability to identify factors that promote anxiety will improve Outcome: Adequate for Discharge Goal: Level of anxiety will decrease Outcome: Adequate for Discharge Goal: Ability to modify response to factors that promote anxiety will improve Outcome: Adequate for Discharge   Problem: Coping: Goal: Ability to identify and develop effective coping behavior will improve Outcome: Adequate  for Discharge Goal: Ability to interact with others will improve Outcome: Adequate for Discharge Goal: Demonstration of participation in decision-making regarding own care will improve Outcome: Adequate for Discharge Goal: Ability to use eye contact when communicating with others will improve Outcome: Adequate for Discharge

## 2018-06-19 NOTE — [facility] Suicide Risk Assessment (Signed)
[facility] Discharge Suicide Risk Assessment   Principal Problem: Chronic untreated psychosis Discharge Diagnoses: Active Problems:   Adjustment disorder   Paranoid schizophrenia (HCC)   Total Time spent with patient: 45 minutes  Musculoskeletal: Strength & Muscle Tone: within normal limits Gait & Station: normal Psychiatric Specialty Exam: ROS no eps no td  Blood pressure 120/88, pulse 82, temperature 98.4 F (36.9 C), temperature source Oral, resp. rate 18, height 5\' 6"  (1.676 m), weight 57.6 kg, SpO2 100 %.Body mass index is 20.5 kg/m.  General Appearance: Casual  Eye Contact::  Fair  Speech:  Normal Rate409  Volume:  Normal  Mood:  Angry and Irritable  Affect:  Restricted  Thought Process:  Irrelevant and Descriptions of Associations: Loose  Orientation:  Full (Time, Place, and Person)  Thought Content:  Tangential  Suicidal Thoughts:  No  Homicidal Thoughts:  No  Memory:  Immediate;   Fair  Judgement:  Impaired  Insight:  Shallow  Psychomotor Activity:  Decreased  Concentration:  Fair  Recall:  [employer] of Knowledge:Fair  Language: Fair  Akathisia:  Negative  Handed:  Right  AIMS (if indicated):     Assets:  Communication Skills Desire for Improvement  Sleep:  Number of Hours: 3  Cognition: WNL  ADL's:  Intact   Mental Status Per Nursing Assessment::   On Admission:  NA  Demographic Factors:  Gay, lesbian, or bisexual orientation  Loss Factors: Decrease in vocational status  Historical Factors: NA  Risk Reduction Factors:   NA  Continued Clinical Symptoms:  Schizophrenia:   Less than 82 years old  Cognitive Features That Contribute To Risk:  Closed-mindedness and Loss of executive function    Suicide Risk:  Minimal: No identifiable suicidal ideation.  Patients presenting with no risk factors but with morbid ruminations; may be classified as minimal risk based on the severity of the depressive symptoms  Follow-up Information    [facility] Follow up.   Specialty:  Pension scheme manager information: [facility] [employer] [address] [place] [address] [phone]           Plan Of Care/Follow-up recommendations:  Activity:  full  Kushal Saunders, MD 06/19/2018, 9:19 AM

## 2018-06-19 NOTE — Plan of Care (Signed)
Pt attended and participated in recreation therapy group sessions.   Deseray Daponte, LRT/CTRS 

## 2018-06-19 NOTE — Plan of Care (Signed)
D: Patient is alert and labile. Though she appears to be responding to internal stimuli she denies SI, HI, AVH, and verbally contracts for safety. Patient is observed talking to herself, looking around, and getting angry when no one is talking to her. Patient denies physical symptoms/pain.   Patient starts yelling at another patient in the dayroom stating "how dare you say sit on your dick". There was no staff present at the very moment to hear if that was in fact what the other patient said. These two patients had a similar situation right before the start of my shift. The patient were asked to go to their room to ensure they wouldn't be around each other.   Patient needs to be a DNA but there is no other room for her roommate. Patient is talking to herself and is angry and her roommate is scared. Roommate is allowed to rest in the quiet room while it isn't in use per roommates nurse.   A: Patient refuses scheduled sleeping medications. Support provided. Patient educated on safety on the unit and medications. Routine safety checks every 15 minutes. Patient stated understanding to tell nurse about any new physical symptoms. Patient understands to tell staff of any needs.     R: No adverse drug reactions noted. Patient verbally contracts for safety. Patient remains safe at this time and will continue to monitor.   Problem: Safety: Goal: Periods of time without injury will increase Outcome: Progressing   Patient remains safe and will continue to monitor.   [place] NOVEL CORONAVIRUS (COVID-19) DAILY CHECK-OFF SYMPTOMS - answer yes or no to each - every day NO YES  Have you had a fever in the past 24 hours?  Fever (Temp > 37.80C / 100F) X   Have you had any of these symptoms in the past 24 hours? New Cough  Sore Throat   Shortness of Breath  Difficulty Breathing  Unexplained Body Aches   X   Have you had any one of these symptoms in the past 24 hours not related to allergies?   Runny Nose   Nasal Congestion  Sneezing   X   If you have had runny nose, nasal congestion, sneezing in the past 24 hours, has it worsened?  X   EXPOSURES - check yes or no X   Have you traveled outside the state in the past 14 days?  X   Have you been in contact with someone with a confirmed diagnosis of COVID-19 or PUI in the past 14 days without wearing appropriate PPE?  X   Have you been living in the same home as a person with confirmed diagnosis of COVID-19 or a PUI (household contact)?    X   Have you been diagnosed with COVID-19?    X              What to do next: Answered NO to all: Answered YES to anything:   Proceed with unit schedule Follow the [facility] Inpatient Flowsheet.

## 2018-06-19 NOTE — Progress Notes (Signed)
Recreation Therapy Notes  Date: 5.4.20 Time: 1000 Location: 500 Hall Dayroom  Group Topic: Anxiety  Goal Area(s) Addresses:  Patient will identify triggers for anxiety. Patient will identify physical symptoms of anxiety Patient will identify coping skills for anxiety.  Intervention:  Worksheet, pencils   Activity: Introduction to Anxiety.  Patients were to identify their triggers, physical symptoms, thoughts and coping skills for dealing with anxiety.  Education:Communication, Discharge Planning  Education Outcome: Acknowledges understanding/In group clarification offered/Needs additional education.   Clinical Observations/Feedback:  Pt did not attend group.   Caroll Rancher, LRT/CTRS        Caroll Rancher A 06/19/2018 11:51 AM
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
## 2021-12-29 ENCOUNTER — Emergency Department (HOSPITAL_COMMUNITY)
Admission: EM | Admit: 2021-12-29 | Discharge: 2021-12-30 | Discharge status: Against Medical Advice | Payer: Medicaid Other

## 2021-12-30 ENCOUNTER — Emergency Department (HOSPITAL_COMMUNITY)
Admission: EM | Admit: 2021-12-30 | Discharge: 2021-12-31 | Disposition: A | Discharge status: Home / Self Care | Payer: Self-pay | Attending: Emergency Medicine | Admitting: Emergency Medicine

## 2021-12-30 ENCOUNTER — Emergency Department (HOSPITAL_COMMUNITY): Payer: Self-pay

## 2021-12-30 ENCOUNTER — Emergency Department (HOSPITAL_COMMUNITY): Payer: Medicaid Other

## 2021-12-30 ENCOUNTER — Other Ambulatory Visit: Payer: Self-pay

## 2021-12-30 DIAGNOSIS — R519 Headache, unspecified: Secondary | ICD-10-CM | POA: Insufficient documentation

## 2021-12-30 DIAGNOSIS — R1013 Epigastric pain: Secondary | ICD-10-CM | POA: Insufficient documentation

## 2021-12-30 DIAGNOSIS — M25561 Pain in right knee: Secondary | ICD-10-CM | POA: Insufficient documentation

## 2021-12-30 DIAGNOSIS — R109 Unspecified abdominal pain: Secondary | ICD-10-CM

## 2021-12-30 NOTE — ED Notes (Signed)
Pt refused to be stuck. States she is terrified of needles

## 2021-12-30 NOTE — ED Provider Triage Note (Signed)
Emergency Medicine Provider Triage Evaluation Note  Amanda Burnett , a 36 y.o. female  was evaluated in triage.  Pt complains of right knee pain stomach pain and headache, going on for last couple days, states all of which is intermittent, she states she is having slight stomach pain at this time no nausea vomiting still passing gas having normal bowel movements no stomach surgeries, no urinary symptoms, no recent knee injury, states the pain is worse with movement, headaches which are also intermittent not the worst of her life, no paresthesia or weakness to upper or lower extremities no recent head trauma, not anticoag's..  Review of Systems  Positive: Headache, stomach pain Negative: Change vision, paresthesias  Physical Exam  BP 127/76   Pulse 95   Temp 97.7 F (36.5 C)   Resp 16   Ht 5\' 6"  (1.676 m)   Wt 72.6 kg   SpO2 99%   BMI 25.82 kg/m  Gen:   Awake, no distress   Resp:  Normal effort  MSK:   Moves extremities without difficulty  Other:  Cranial nerves II through XII grossly intact no difficulty with word finding following two-step commands no regular weakness present.  Medical Decision Making  Medically screening exam initiated at 11:13 PM.  Appropriate orders placed.  Amanda Burnett was informed that the remainder of the evaluation will be completed by another provider, this initial triage assessment does not replace that evaluation, and the importance of remaining in the ED until their evaluation is complete.  Lab work imaging been ordered going for the work-up.   [name], PA-C 12/30/21 2315

## 2021-12-30 NOTE — ED Triage Notes (Signed)
Pt c/o generalized abdominal pain for the past weeks. No NVD. Was seen at [facility] for the same. Has take OTC meds (ibuprofen/pepto) and GI cocktails without relief.  Also c/o generalized body pain. Denies cough/fever/chills.

## 2021-12-31 ENCOUNTER — Other Ambulatory Visit (HOSPITAL_COMMUNITY): Payer: Self-pay

## 2021-12-31 ENCOUNTER — Emergency Department (HOSPITAL_COMMUNITY)
Admission: EM | Admit: 2021-12-31 | Discharge: 2021-12-31 | Disposition: A | Discharge status: Home / Self Care | Payer: Medicaid Other | Attending: Emergency Medicine | Admitting: Emergency Medicine

## 2021-12-31 ENCOUNTER — Other Ambulatory Visit: Payer: Self-pay

## 2021-12-31 DIAGNOSIS — G894 Chronic pain syndrome: Secondary | ICD-10-CM | POA: Insufficient documentation

## 2021-12-31 LAB — PREGNANCY, URINE: Preg Test, Ur: NEGATIVE

## 2021-12-31 MED ORDER — MELOXICAM 15 MG PO TABS
15.0000 mg | ORAL_TABLET | Freq: Every day | ORAL | 0 refills | Status: AC
  Filled 2021-12-31: qty 10, 10d supply, fill #0

## 2021-12-31 MED ORDER — IBUPROFEN 800 MG PO TABS
800.0000 mg | ORAL_TABLET | Freq: Once | ORAL | Status: AC
  Administered 2021-12-31: 800 mg via ORAL
  Filled 2021-12-31: qty 1

## 2021-12-31 MED ORDER — DICYCLOMINE HCL 20 MG PO TABS
20.0000 mg | ORAL_TABLET | Freq: Two times a day (BID) | ORAL | 0 refills | Status: AC
  Filled 2021-12-31: qty 20, 10d supply, fill #0

## 2021-12-31 MED ORDER — ETODOLAC 300 MG PO CAPS
300.0000 mg | ORAL_CAPSULE | Freq: Three times a day (TID) | ORAL | 0 refills | Status: AC
  Filled 2021-12-31: qty 21, 7d supply, fill #0

## 2021-12-31 NOTE — ED Provider Notes (Signed)
[facility] EMERGENCY DEPARTMENT Provider Note   CSN: [id number] Arrival date & time: 12/30/21  2047     History  Chief Complaint  Patient presents with   Abdominal Pain    Amanda Burnett is a 36 y.o. female.   Abdominal Pain    Patient has history of anxiety and depression.  Patient presents to the ED for evaluation of persistent abdominal pain as well as headache and knee pain.  Symptoms are intermittent.  She is not having any trouble with any fevers.  No vomiting or diarrhea.  No dysuria.  She has not had any prior abdominal surgeries.  No recent injuries.  Patient has been seen several times at other facilities.  Patient states her tests were normal.  The medication she was given have not been helping so she came to the ED for evaluation.  Patient has refused any additional laboratory testing.  She states she just wants a different prescription.  Home Medications Prior to Admission medications   Medication Sig Start Date End Date Taking? Authorizing Provider  dicyclomine (BENTYL) 20 MG tablet Take 1 tablet (20 mg total) by mouth 2 (two) times daily. 12/31/21  Yes Linwood Dibbles, MD  etodolac (LODINE) 300 MG capsule Take 1 capsule (300 mg total) by mouth every 8 (eight) hours. 12/31/21  Yes Linwood Dibbles, MD  ARIPiprazole ER (ABILIFY MAINTENA) 400 MG SRER injection Inject 2 mLs (400 mg total) into the muscle every 28 (twenty-eight) days. 06/19/18   Malvin Johns, MD  benztropine (COGENTIN) 0.5 MG tablet Take 1 tablet (0.5 mg total) by mouth 2 (two) times daily. 06/19/18   Malvin Johns, MD  risperiDONE (RISPERDAL) 3 MG tablet Take 1 tablet (3 mg total) by mouth 2 (two) times daily. 06/19/18   Malvin Johns, MD      Allergies    Patient has no known allergies.    Review of Systems   Review of Systems  Gastrointestinal:  Positive for abdominal pain.    Physical Exam Updated Vital Signs BP (!) 114/96 (BP Location: Left Arm)   Pulse 71   Temp 98 F (36.7 C) (Oral)    Resp 18   Ht 1.676 m (5\' 6" )   Wt 72.6 kg   LMP 12/30/2021 (Exact Date) Comment: states she is on her period currently and not currently sexually active  SpO2 100%   BMI 25.82 kg/m  Physical Exam  ED Results / Procedures / Treatments   Labs (all labs ordered are listed, but only abnormal results are displayed) Labs Reviewed  PREGNANCY, URINE  COMPREHENSIVE METABOLIC PANEL  LIPASE, BLOOD  CBC WITH DIFFERENTIAL/PLATELET    EKG None  Radiology DG Abdomen Acute W/Chest  Result Date: 12/30/2021 CLINICAL DATA:  Epigastric abdominal pain EXAM: DG ABDOMEN ACUTE WITH 1 VIEW CHEST COMPARISON:  None Available. FINDINGS: There is no evidence of dilated bowel loops or free intraperitoneal air. No radiopaque calculi or other significant radiographic abnormality is seen. Heart size and mediastinal contours are within normal limits. Both lungs are clear. IMPRESSION: Negative abdominal radiographs.  No acute cardiopulmonary disease. Electronically Signed   By: 01/01/2022 M.D.   On: 12/30/2021 23:38   DG Knee Complete 4 Views Right  Result Date: 12/30/2021 CLINICAL DATA:  Right knee pain EXAM: RIGHT KNEE - COMPLETE 4+ VIEW COMPARISON:  None Available. FINDINGS: No evidence of fracture, dislocation, or joint effusion. No evidence of arthropathy or other focal bone abnormality. Soft tissues are unremarkable. IMPRESSION: Negative. Electronically Signed   By:  Helyn Numbers M.D.   On: 12/30/2021 23:37    Procedures Procedures    Medications Ordered in ED Medications - No data to display  ED Course/ Medical Decision Making/ A&P Clinical Course as of 12/31/21 0919  Thu Dec 31, 2021  0910 Outpatient records reviewed.  Patient had a CT scan on October 17 that did not show any acute findings  [JK]  0910 Patient had a bedside ultrasound on November 8.  She had abdominal x-rays on November 9.  No acute findings there.  Patient also had knee x-rays noted on November 13 [JK]  0919 No acute  findings noted on knee and abdominal x-rays [JK]    Clinical Course User Index [JK] Linwood Dibbles, MD                           Medical Decision Making Problems Addressed: Abdominal pain, unspecified abdominal location: chronic illness or injury  Amount and/or Complexity of Data Reviewed External Data Reviewed:     Details: Reviewed outpatient records from [facility] including the imaging results and notes Radiology: ordered and independent interpretation performed.  Risk Prescription drug management.   Patient presents with complaints of persistent knee pain abdominal pain and headaches.  Her exam is reassuring.  There is no signs of acute swelling or infection.  Her abdomen is actually soft and nontender.  Patient has refused additional laboratory testing.  I do not feel that is necessary at this time.  She has had a CT scan as well as an ultrasound at previous recent ED visits.  Patient does not want any additional testing now.  Evaluation is limited for that reason however her exam is benign and reassuring and I do not feel that any additional testing is necessary.  Evaluation and diagnostic testing in the emergency department does not suggest an emergent condition requiring admission or immediate intervention beyond what has been performed at this time.  The patient is safe for discharge and has been instructed to return immediately for worsening symptoms, change in symptoms or any other concerns.        Final Clinical Impression(s) / ED Diagnoses Final diagnoses:  Abdominal pain, unspecified abdominal location    Rx / DC Orders ED Discharge Orders          Ordered    dicyclomine (BENTYL) 20 MG tablet  2 times daily        12/31/21 0917    etodolac (LODINE) 300 MG capsule  Every 8 hours       Note to Pharmacy: As needed for pain   12/31/21 0917              Linwood Dibbles, MD 12/31/21 [phone]

## 2021-12-31 NOTE — Discharge Instructions (Signed)
Take the medications as prescribed.  Consider following up with a primary care doctor and GI doctor for further evaluation

## 2021-12-31 NOTE — Discharge Instructions (Addendum)
Follow up with the attached primary care provider for long term management of your chronic pain.  I've sent a different prescription to your pharmacy. You can use GoodRx online for coupons.

## 2021-12-31 NOTE — ED Notes (Signed)
Pt given a Sprite, [place] sandwich, peanut butter and graham crackers.

## 2021-12-31 NOTE — ED Triage Notes (Signed)
Patient c/o pain to head, right knee and stomach. Patient states she was seen here recently and prescribed pain medicine but is unable to afford the prescription and is requesting meds to help her til she can get them. Patient states tylenol and motrin are not helping and she needs stronger pain meds. States she was slammed by the police a few months ago and that is why he pain is there.

## 2021-12-31 NOTE — ED Provider Notes (Signed)
[facility] EMERGENCY DEPARTMENT Provider Note   CSN: [id number] Arrival date & time: 12/31/21  1519     History  Chief Complaint  Patient presents with   Requesting Pain Medicine    Amanda Burnett is a 36 y.o. female who presents to the emergency department requesting pain medication. Was seen yesterday for same, was discharged with two prescriptions but says they were too expensive. She has been trying tylenol and motrin without relief. She wants stronger pain medication. States she was "slammed" by the police a few months ago, which is what caused the pain. Denies any changes in pain since yesterday.  HPI     Home Medications Prior to Admission medications   Medication Sig Start Date End Date Taking? Authorizing Provider  meloxicam (MOBIC) 15 MG tablet Take 1 tablet (15 mg total) by mouth daily for 10 days. 12/31/21 01/10/22 Yes Amberrose Friebel T, PA-C  ARIPiprazole ER (ABILIFY MAINTENA) 400 MG SRER injection Inject 2 mLs (400 mg total) into the muscle every 28 (twenty-eight) days. 06/19/18   Malvin Johns, MD  benztropine (COGENTIN) 0.5 MG tablet Take 1 tablet (0.5 mg total) by mouth 2 (two) times daily. 06/19/18   Malvin Johns, MD  dicyclomine (BENTYL) 20 MG tablet Take 1 tablet (20 mg total) by mouth 2 (two) times daily. 12/31/21   Linwood Dibbles, MD  etodolac (LODINE) 300 MG capsule Take 1 capsule (300 mg total) by mouth every 8 (eight) hours as needed for pain 12/31/21   Linwood Dibbles, MD  risperiDONE (RISPERDAL) 3 MG tablet Take 1 tablet (3 mg total) by mouth 2 (two) times daily. 06/19/18   Malvin Johns, MD      Allergies    Patient has no known allergies.    Review of Systems   Review of Systems  Musculoskeletal:  Positive for myalgias.  All other systems reviewed and are negative.   Physical Exam Updated Vital Signs BP 120/82 (BP Location: Left Arm)   Pulse 96   Temp 97.8 F (36.6 C) (Oral)   Resp 15   Ht 5\' 6"  (1.676 m)   Wt 72.6 kg   LMP  12/30/2021 (Exact Date) Comment: states she is on her period currently and not currently sexually active  SpO2 100%   BMI 25.82 kg/m  Physical Exam Vitals and nursing note reviewed.  Constitutional:      Appearance: Normal appearance.  HENT:     Head: Normocephalic and atraumatic.  Eyes:     Conjunctiva/sclera: Conjunctivae normal.  Pulmonary:     Effort: Pulmonary effort is normal. No respiratory distress.  Skin:    General: Skin is warm and dry.  Neurological:     Mental Status: She is alert.  Psychiatric:        Mood and Affect: Mood normal.        Behavior: Behavior normal.     ED Results / Procedures / Treatments   Labs (all labs ordered are listed, but only abnormal results are displayed) Labs Reviewed - No data to display  EKG None  Radiology DG Abdomen Acute W/Chest  Result Date: 12/30/2021 CLINICAL DATA:  Epigastric abdominal pain EXAM: DG ABDOMEN ACUTE WITH 1 VIEW CHEST COMPARISON:  None Available. FINDINGS: There is no evidence of dilated bowel loops or free intraperitoneal air. No radiopaque calculi or other significant radiographic abnormality is seen. Heart size and mediastinal contours are within normal limits. Both lungs are clear. IMPRESSION: Negative abdominal radiographs.  No acute cardiopulmonary disease. Electronically Signed  By: Helyn Numbers M.D.   On: 12/30/2021 23:38   DG Knee Complete 4 Views Right  Result Date: 12/30/2021 CLINICAL DATA:  Right knee pain EXAM: RIGHT KNEE - COMPLETE 4+ VIEW COMPARISON:  None Available. FINDINGS: No evidence of fracture, dislocation, or joint effusion. No evidence of arthropathy or other focal bone abnormality. Soft tissues are unremarkable. IMPRESSION: Negative. Electronically Signed   By: Helyn Numbers M.D.   On: 12/30/2021 23:37    Procedures Procedures    Medications Ordered in ED Medications  ibuprofen (ADVIL) tablet 800 mg (800 mg Oral Given 12/31/21 1739)    ED Course/ Medical Decision Making/  A&P                           Medical Decision Making Risk Prescription drug management.   Patient is 36 year old female with history of anxiety and depression who presents to the ER today requesting pain medication.  Per chart review, was seen yesterday for abdominal pain. Had reassuring workup and discharged with prescriptions for dicyclomaine and etodolac. States they were too expensive so she did not fill them. Denies any changes in symptoms since yesterday.   This is patient's 11th ER visit for similar symptoms. Do not feel she is requiring full laboratory evaluation for such.  Advised patient I would not give her narcotics. Offered her tylenol or advil, she elected for advil. I offered that I could change her prescription to a similar medicine that is cheaper. Encouraged her to use GoodRx for prescription discounts. Patient discharged in stable condition.   Final Clinical Impression(s) / ED Diagnoses Final diagnoses:  Chronic pain syndrome    Rx / DC Orders ED Discharge Orders          Ordered    meloxicam (MOBIC) 15 MG tablet  Daily        12/31/21 1727           Portions of this report may have been transcribed using voice recognition software. Every effort was made to ensure accuracy; however, inadvertent computerized transcription errors may be present.    Inanna Telford T, PA-C 12/31/21 1804    Rexford Maus, DO 12/31/21 2236

## 2021-12-31 NOTE — ED Notes (Signed)
Pt didn't answer to be triage

## 2022-01-01 ENCOUNTER — Encounter (HOSPITAL_COMMUNITY): Payer: Self-pay

## 2022-01-01 ENCOUNTER — Other Ambulatory Visit (HOSPITAL_COMMUNITY): Payer: Self-pay

## 2022-01-01 ENCOUNTER — Emergency Department (HOSPITAL_COMMUNITY)
Admission: EM | Admit: 2022-01-01 | Discharge: 2022-01-02 | Disposition: A | Discharge status: Home / Self Care | Payer: Medicaid Other | Attending: Emergency Medicine | Admitting: Emergency Medicine

## 2022-01-01 ENCOUNTER — Other Ambulatory Visit: Payer: Self-pay

## 2022-01-01 DIAGNOSIS — R519 Headache, unspecified: Secondary | ICD-10-CM

## 2022-01-01 NOTE — ED Provider Triage Note (Signed)
Emergency Medicine Provider Triage Evaluation Note  Amanda Burnett , a 36 y.o. female  was evaluated in triage.  Pt complains of headache that started 2 hours ago.  She states it hurts all over her head, provides a vague description of symptoms.  This is patient's third ER visit since 11/15.  Denies fever, chills, changes in vision, photophobia, lightheadedness, dizziness.  Review of Systems  Positive: See above Negative: See above  Physical Exam  BP 114/75   Pulse 68   Temp 98.4 F (36.9 C) (Oral)   Resp 16   LMP 12/30/2021 (Exact Date) Comment: states she is on her period currently and not currently sexually active  SpO2 100%  Gen:   Awake, no distress   Resp:  Normal effort  MSK:   Moves extremities without difficulty  Other:    Medical Decision Making  Medically screening exam initiated at 8:21 PM.  Appropriate orders placed.  Amanda Burnett was informed that the remainder of the evaluation will be completed by another provider, this initial triage assessment does not replace that evaluation, and the importance of remaining in the ED until their evaluation is complete.     Melton Alar R, [place] 01/01/22 2024

## 2022-01-01 NOTE — ED Triage Notes (Signed)
Pt reports generalized headache that started two hours PTA (1830). Denies n/v/blurry vision

## 2022-01-02 DIAGNOSIS — R519 Headache, unspecified: Secondary | ICD-10-CM | POA: Insufficient documentation

## 2022-01-02 MED ORDER — DIPHENHYDRAMINE HCL 25 MG PO CAPS
25.0000 mg | ORAL_CAPSULE | Freq: Once | ORAL | Status: AC
  Administered 2022-01-02: 25 mg via ORAL
  Filled 2022-01-02: qty 1

## 2022-01-02 MED ORDER — METOCLOPRAMIDE HCL 5 MG/ML IJ SOLN: 5.0000 mg | Freq: Once | INTRAMUSCULAR | Status: DC

## 2022-01-02 MED ORDER — METOCLOPRAMIDE HCL 10 MG PO TABS
5.0000 mg | ORAL_TABLET | Freq: Once | ORAL | Status: AC
  Administered 2022-01-02: 5 mg via ORAL
  Filled 2022-01-02: qty 1

## 2022-01-02 MED ORDER — KETOROLAC TROMETHAMINE 30 MG/ML IJ SOLN: 30.0000 mg | Freq: Once | INTRAMUSCULAR | Status: DC

## 2022-01-02 MED ORDER — IBUPROFEN 800 MG PO TABS
800.0000 mg | ORAL_TABLET | Freq: Once | ORAL | Status: AC
  Administered 2022-01-02: 800 mg via ORAL
  Filled 2022-01-02: qty 1

## 2022-01-02 MED ORDER — DIPHENHYDRAMINE HCL 50 MG/ML IJ SOLN: 12.5000 mg | Freq: Once | INTRAMUSCULAR | Status: DC

## 2022-01-02 MED ORDER — SODIUM CHLORIDE 0.9 % IV BOLUS: 500.0000 mL | Freq: Once | INTRAVENOUS | Status: DC

## 2022-01-02 NOTE — ED Provider Notes (Signed)
[facility] EMERGENCY DEPARTMENT Provider Note   CSN: [id number] Arrival date & time: 01/01/22  1938     History  Chief Complaint  Patient presents with   Headache    Amanda Burnett is a 36 y.o. female headache.  She reports onset of headache about 2 hours prior to arrival 13 hours ago.  Unfortunately had a she had a prolonged wait in our waiting room.  Patient does get headaches.  She states that sometimes her headaches are very bad.  She does not have a formal diagnosis of migraines but describes a global throbbing headache.  She did not take anything for the headache prior to arrival but states that normally she will try to lie down in a dark room and use Tylenol or Advil.  She denies fevers, neck stiffness, rash.  She denies IV drug use or abnormal neurologic symptoms.  He denies thunderclap onset.   Headache      Home Medications Prior to Admission medications   Medication Sig Start Date End Date Taking? Authorizing Provider  ARIPiprazole ER (ABILIFY MAINTENA) 400 MG SRER injection Inject 2 mLs (400 mg total) into the muscle every 28 (twenty-eight) days. 06/19/18   Malvin Johns, MD  benztropine (COGENTIN) 0.5 MG tablet Take 1 tablet (0.5 mg total) by mouth 2 (two) times daily. 06/19/18   Malvin Johns, MD  dicyclomine (BENTYL) 20 MG tablet Take 1 tablet (20 mg total) by mouth 2 (two) times daily. 12/31/21   Linwood Dibbles, MD  etodolac (LODINE) 300 MG capsule Take 1 capsule (300 mg total) by mouth every 8 (eight) hours as needed for pain 12/31/21   Linwood Dibbles, MD  meloxicam (MOBIC) 15 MG tablet Take 1 tablet (15 mg total) by mouth daily for 10 days. 12/31/21 01/11/22  Roemhildt, Lorin T, PA-C  risperiDONE (RISPERDAL) 3 MG tablet Take 1 tablet (3 mg total) by mouth 2 (two) times daily. 06/19/18   Malvin Johns, MD      Allergies    Patient has no known allergies.    Review of Systems   Review of Systems  Neurological:  Positive for headaches.    Physical  Exam Updated Vital Signs BP 113/72 (BP Location: Right Arm)   Pulse 71   Temp 98 F (36.7 C) (Oral)   Resp 16   Ht 5\' 6"  (1.676 m)   Wt 72.6 kg   LMP 12/30/2021 (Exact Date) Comment: states she is on her period currently and not currently sexually active  SpO2 100%   BMI 25.82 kg/m  Physical Exam Physical Exam  Constitutional: Pt is oriented to person, place, and time. Pt appears well-developed and well-nourished. No distress.  HENT:  Head: Normocephalic and atraumatic.  Mouth/Throat: Oropharynx is clear and moist.  Eyes: Conjunctivae and EOM are normal. Pupils are equal, round, and reactive to light. No scleral icterus.  No horizontal, vertical or rotational nystagmus  Neck: Normal range of motion. Neck supple.  Full active and passive ROM without pain No midline or paraspinal tenderness No nuchal rigidity or meningeal signs  Cardiovascular: Normal rate, regular rhythm and intact distal pulses.   Pulmonary/Chest: Effort normal and breath sounds normal. No respiratory distress. Pt has no wheezes. No rales.  Abdominal: Soft. Bowel sounds are normal. There is no tenderness. There is no rebound and no guarding.  Musculoskeletal: Normal range of motion.  Lymphadenopathy:    No cervical adenopathy.  Neurological: Pt. is alert and oriented to person, place, and time. He has normal reflexes.  No cranial nerve deficit.  Exhibits normal muscle tone. Coordination normal.  Mental Status:  Alert, oriented, thought content appropriate. Speech fluent without evidence of aphasia. Able to follow 2 step commands without difficulty.  Cranial Nerves:  II:  Peripheral visual fields grossly normal, pupils equal, round, reactive to light III,IV, VI: ptosis not present, extra-ocular motions intact bilaterally  V,VII: smile symmetric, facial light touch sensation equal VIII: hearing grossly normal bilaterally  IX,X: midline uvula rise  XI: bilateral shoulder shrug equal and strong XII: midline tongue  extension  Motor:  5/5 in upper and lower extremities bilaterally including strong and equal grip strength and dorsiflexion/plantar flexion Sensory: Pinprick and light touch normal in all extremities.  Deep Tendon Reflexes: 2+ and symmetric  Cerebellar: normal finger-to-nose with bilateral upper extremities Gait: normal gait and balance CV: distal pulses palpable throughout   Skin: Skin is warm and dry. No rash noted. Pt is not diaphoretic.  Psychiatric: Pt has a normal mood and affect. Behavior is normal. Judgment and thought content normal.  Nursing note and vitals reviewed.  ED Results / Procedures / Treatments   Labs (all labs ordered are listed, but only abnormal results are displayed) Labs Reviewed - No data to display  EKG None  Radiology No results found.  Procedures Procedures    Medications Ordered in ED Medications  metoCLOPramide (REGLAN) tablet 5 mg (has no administration in time range)  diphenhydrAMINE (BENADRYL) capsule 25 mg (has no administration in time range)  ibuprofen (ADVIL) tablet 800 mg (has no administration in time range)    ED Course/ Medical Decision Making/ A&P                           Medical Decision Making Risk Prescription drug management.   Kerrington Sova presents with headache Given the large differential diagnosis for [name], the decision making in this case is of high complexity.  After evaluating all of the data points in this case, the presentation of Larraine Argo is NOT consistent with skull fracture, meningitis/encephalitis, SAH/sentinel bleed, Intracranial Hemorrhage (ICH) (subdural/epidural), acute obstructive hydrocephalus, space occupying lesions, CVA, CO Poisoning, Basilar/vertebral artery dissection, preeclampsia, cerebral venous thrombosis, hypertensive emergency, temporal Arteritis, Idiopathic Intracranial Hypertension (pseudotumor cerebri).  Strict return and follow-up precautions have been given by me  personally or by detailed written instructions verbalized by nursing staff using the teach back method to patient/family/caregiver.  Data Reviewed/Counseling: I have reviewed the patient's vital signs, nursing notes, and other relevant tests/information. I had a detailed discussion regarding the historical points, exam findings, and any diagnostic results supporting the discharge diagnosis. I also discussed the need for outpatient follow-up and the need to return to the ED if symptoms worsen or if there are any questions or concerns that arise at [facility]         Final Clinical Impression(s) / ED Diagnoses Final diagnoses:  Bad headache    Rx / DC Orders ED Discharge Orders     None         Arthor Captain, PA-C 01/02/22 0830    Gwyneth Sprout, MD 01/03/22 [phone]

## 2022-01-02 NOTE — Discharge Instructions (Signed)

## 2022-01-02 NOTE — ED Notes (Signed)
Pt called again x3 for vitals recheck, no response. Moving pt OTF.

## 2022-01-03 ENCOUNTER — Encounter (HOSPITAL_COMMUNITY): Payer: Self-pay | Admitting: *Deleted

## 2022-01-03 ENCOUNTER — Emergency Department (HOSPITAL_COMMUNITY)
Admission: EM | Admit: 2022-01-03 | Discharge: 2022-01-03 | Disposition: A | Discharge status: Home / Self Care | Payer: Medicaid Other | Attending: Emergency Medicine | Admitting: Emergency Medicine

## 2022-01-03 ENCOUNTER — Other Ambulatory Visit: Payer: Self-pay

## 2022-01-03 DIAGNOSIS — Z20822 Contact with and (suspected) exposure to covid-19: Secondary | ICD-10-CM | POA: Insufficient documentation

## 2022-01-03 DIAGNOSIS — R519 Headache, unspecified: Secondary | ICD-10-CM | POA: Insufficient documentation

## 2022-01-03 DIAGNOSIS — M791 Myalgia, unspecified site: Secondary | ICD-10-CM | POA: Insufficient documentation

## 2022-01-03 LAB — CBC WITH DIFFERENTIAL/PLATELET
Abs Immature Granulocytes: 0.02 10*3/uL (ref 0.00–0.07)
Basophils Absolute: 0 10*3/uL (ref 0.0–0.1)
Basophils Relative: 0 %
Eosinophils Absolute: 0 10*3/uL (ref 0.0–0.5)
Eosinophils Relative: 0 %
HCT: 42.2 % (ref 36.0–46.0)
Hemoglobin: 14.3 g/dL (ref 12.0–15.0)
Immature Granulocytes: 0 %
Lymphocytes Relative: 14 %
Lymphs Abs: 0.7 10*3/uL (ref 0.7–4.0)
MCH: 31.6 pg (ref 26.0–34.0)
MCHC: 33.9 g/dL (ref 30.0–36.0)
MCV: 93.4 fL (ref 80.0–100.0)
Monocytes Absolute: 0.1 10*3/uL (ref 0.1–1.0)
Monocytes Relative: 1 %
Neutro Abs: 4.3 10*3/uL (ref 1.7–7.7)
Neutrophils Relative %: 85 %
Platelets: 284 10*3/uL (ref 150–400)
RBC: 4.52 MIL/uL (ref 3.87–5.11)
RDW: 13 % (ref 11.5–15.5)
WBC: 5.1 10*3/uL (ref 4.0–10.5)
nRBC: 0 % (ref 0.0–0.2)

## 2022-01-03 LAB — RESP PANEL BY RT-PCR (FLU A&B, COVID) ARPGX2
Influenza A by PCR: NEGATIVE
Influenza B by PCR: NEGATIVE
SARS Coronavirus 2 by RT PCR: NEGATIVE

## 2022-01-03 LAB — CK: Total CK: 185 U/L (ref 38–234)

## 2022-01-03 LAB — COMPREHENSIVE METABOLIC PANEL
ALT: 20 U/L (ref 0–44)
AST: 25 U/L (ref 15–41)
Albumin: 4.2 g/dL (ref 3.5–5.0)
Alkaline Phosphatase: 71 U/L (ref 38–126)
Anion gap: 11 (ref 5–15)
BUN: 10 mg/dL (ref 6–20)
CO2: 23 mmol/L (ref 22–32)
Calcium: 9.6 mg/dL (ref 8.9–10.3)
Chloride: 105 mmol/L (ref 98–111)
Creatinine, Ser: 0.89 mg/dL (ref 0.44–1.00)
GFR, Estimated: >60 mL/min (ref >60)
Glucose, Bld: 132 mg/dL — ABNORMAL HIGH (ref 70–99)
Potassium: 4.4 mmol/L (ref 3.5–5.1)
Sodium: 139 mmol/L (ref 135–145)
Total Bilirubin: 0.5 mg/dL (ref 0.3–1.2)
Total Protein: 7.7 g/dL (ref 6.5–8.1)

## 2022-01-03 MED ORDER — OXYCODONE-ACETAMINOPHEN 5-325 MG PO TABS
1.0000 | ORAL_TABLET | Freq: Once | ORAL | Status: AC
  Administered 2022-01-03: 1 via ORAL
  Filled 2022-01-03: qty 1

## 2022-01-03 MED ORDER — DEXAMETHASONE 4 MG PO TABS
10.0000 mg | ORAL_TABLET | Freq: Once | ORAL | Status: AC
  Administered 2022-01-03: 10 mg via ORAL
  Filled 2022-01-03: qty 3

## 2022-01-03 MED ORDER — IBUPROFEN 800 MG PO TABS
800.0000 mg | ORAL_TABLET | Freq: Once | ORAL | Status: DC
  Filled 2022-01-03: qty 1

## 2022-01-03 MED ORDER — PROCHLORPERAZINE MALEATE 5 MG PO TABS
10.0000 mg | ORAL_TABLET | Freq: Once | ORAL | Status: AC
  Administered 2022-01-03: 10 mg via ORAL
  Filled 2022-01-03: qty 2

## 2022-01-03 MED ORDER — ACETAMINOPHEN 500 MG PO TABS
1000.0000 mg | ORAL_TABLET | Freq: Once | ORAL | Status: AC
  Administered 2022-01-03: 1000 mg via ORAL
  Filled 2022-01-03: qty 2

## 2022-01-03 MED ORDER — IBUPROFEN 800 MG PO TABS
800.0000 mg | ORAL_TABLET | Freq: Once | ORAL | Status: AC
  Administered 2022-01-03: 800 mg via ORAL
  Filled 2022-01-03: qty 1

## 2022-01-03 NOTE — ED Provider Triage Note (Signed)
  Emergency Medicine Provider Triage Evaluation Note  MRN:  [id number]  Arrival date & time: 01/03/22    Medically screening exam initiated at 1:45 AM.   CC:   Headache   HPI:  Amanda Burnett is a 36 y.o. year-old female presents to the ED with chief complaint of headache.  States that this feels similar to previous headaches.  States that she was seen recently for the same.  Denies successful treatments PTA.  History provided by patient. ROS:  -As included in HPI PE:   Vitals:   01/03/22 0128  BP: 107/76  Pulse: 80  Resp: 16  Temp: 97.8 F (36.6 C)  SpO2: 97%    Non-toxic appearing No respiratory distress CN 3-12 grossly intact. MDM:  Patient here with recurrent headache. I've ordered ibuprofen in triage to expedite lab/diagnostic workup.  Patient was informed that the remainder of the evaluation will be completed by another provider, this initial triage assessment does not replace that evaluation, and the importance of remaining in the ED until their evaluation is complete.    Roxy Horseman, PA-C 01/03/22 0147

## 2022-01-03 NOTE — ED Provider Notes (Signed)
[facility] EMERGENCY DEPARTMENT Provider Note   CSN: [id number] Arrival date & time: 01/03/22  1828     History  Chief Complaint  Patient presents with   Generalized Body Aches    Amanda Burnett is a 36 y.o. female.  HPI     36 year old female comes in with chief complaint of generalized body ache, generalized headache.  She states that she has been feeling this way for the last few days.  Headaches are described as generalized.  Body aches are also generalized.  She denies any associated focal weakness, numbness, vision change, slurred speech, dizziness.  Patient has no significant medical history.  She denies using any drugs.  She denies any sick contacts.  She denies any runny nose, congestion, cough, chest pain, back pain.  Patient also denies any tick bites.  Seen in the ER recently for generalized headaches on 2 separate occasions and was seen in the ER this morning for headaches.  Home Medications Prior to Admission medications   Medication Sig Start Date End Date Taking? Authorizing Provider  ARIPiprazole ER (ABILIFY MAINTENA) 400 MG SRER injection Inject 2 mLs (400 mg total) into the muscle every 28 (twenty-eight) days. 06/19/18   Malvin Johns, MD  benztropine (COGENTIN) 0.5 MG tablet Take 1 tablet (0.5 mg total) by mouth 2 (two) times daily. 06/19/18   Malvin Johns, MD  dicyclomine (BENTYL) 20 MG tablet Take 1 tablet (20 mg total) by mouth 2 (two) times daily. 12/31/21   Linwood Dibbles, MD  etodolac (LODINE) 300 MG capsule Take 1 capsule (300 mg total) by mouth every 8 (eight) hours as needed for pain 12/31/21   Linwood Dibbles, MD  meloxicam (MOBIC) 15 MG tablet Take 1 tablet (15 mg total) by mouth daily for 10 days. 12/31/21 01/11/22  Roemhildt, Lorin T, PA-C  risperiDONE (RISPERDAL) 3 MG tablet Take 1 tablet (3 mg total) by mouth 2 (two) times daily. 06/19/18   Malvin Johns, MD      Allergies    Patient has no known allergies.    Review of Systems   Review of  Systems  Physical Exam Updated Vital Signs BP 114/76 (BP Location: Left Arm)   Pulse 75   Temp 97.9 F (36.6 C) (Oral)   Resp 16   Ht 5\' 6"  (1.676 m)   Wt 72.6 kg   LMP 12/30/2021 (Exact Date) Comment: states she is on her period currently and not currently sexually active  SpO2 98%   BMI 25.82 kg/m  Physical Exam Vitals and nursing note reviewed.  Constitutional:      Appearance: She is well-developed.  HENT:     Head: Atraumatic.  Eyes:     Extraocular Movements: Extraocular movements intact.     Pupils: Pupils are equal, round, and reactive to light.  Cardiovascular:     Rate and Rhythm: Normal rate.  Pulmonary:     Effort: Pulmonary effort is normal.  Musculoskeletal:     Cervical back: Normal range of motion and neck supple.  Skin:    General: Skin is warm and dry.  Neurological:     Mental Status: She is alert and oriented to person, place, and time.     Cranial Nerves: No cranial nerve deficit.     Sensory: No sensory deficit.     Motor: No weakness.     Coordination: Coordination normal.     ED Results / Procedures / Treatments   Labs (all labs ordered are listed, but only abnormal  results are displayed) Labs Reviewed  COMPREHENSIVE METABOLIC PANEL - Abnormal; Notable for the following components:      Result Value   Glucose, Bld 132 (*)    All other components within normal limits  RESP PANEL BY RT-PCR (FLU A&B, COVID) ARPGX2  CBC WITH DIFFERENTIAL/PLATELET  CK    EKG None  Radiology No results found.  Procedures Procedures    Medications Ordered in ED Medications  oxyCODONE-acetaminophen (PERCOCET/ROXICET) 5-325 MG per tablet 1 tablet (1 tablet Oral Given 01/03/22 2026)    ED Course/ Medical Decision Making/ A&P                           Medical Decision Making Amount and/or Complexity of Data Reviewed Labs: ordered.  Risk Prescription drug management.   36 year old patient with history of mental illness comes in with chief  complaint of generalized pain.  She is complaining of generalized headaches without any neurodeficits.  She has no meningismus and her neuro exam is nonfocal. She is complaining of generalized body aches without any history of substance use disorder, tick bites, rashes or recent URI-like symptoms.  Differential diagnosis considered includes viral illness, rhabdomyolysis, medication side effects, tickborne illness.  I have reviewed care everywhere.  Patient was seen at [facility] system with generalized headaches and generalized abdominal pain.  She has also been seen in the ER recently for headaches.  With her neuro exam being completely nonfocal, I do not think a CT scan of the brain is indicated.  Labs ordered, including CK.  If results are normal then we will discharge.   9:47 PM Requesting covid test. Ordered, but will d/c. Low concerns. Had COVID test last month with same symptoms and was negative.  At this time she has no urinary symptoms.  Final Clinical Impression(s) / ED Diagnoses Final diagnoses:  Myalgia  Generalized headaches    Rx / DC Orders ED Discharge Orders     None         Derwood Kaplan, MD 01/03/22 2148

## 2022-01-03 NOTE — ED Provider Triage Note (Signed)
Emergency Medicine Provider Triage Evaluation Note  Amanda Burnett , a 36 y.o. female  was evaluated in triage.  Pt complains of generalized body aches. During triage patient states "Dr. Elmer Bales my dad, Amanda Burnett is my mom.  I had some issues with barium swallow test anyone with drawing blood or needles because I am in a custody battle."  Denies history of anxiety, depression, treatment for psychiatric conditions.  Denies auditory/visual hallucinations, SI, HI.  Review of Systems  Positive:  Negative:   Physical Exam  BP 107/75 (BP Location: Right Arm)   Pulse 90   Temp 98.3 F (36.8 C) (Oral)   Resp 16   Ht 5\' 6"  (1.676 m)   Wt 72.6 kg   LMP 12/30/2021 (Exact Date) Comment: states she is on her period currently and not currently sexually active  SpO2 96%   BMI 25.82 kg/m  Gen:   Awake, no distress   Resp:  Normal effort  MSK:   Moves extremities without difficulty  Other:    Medical Decision Making  Medically screening exam initiated at 6:52 PM.  Appropriate orders placed.  Amanda Burnett was informed that the remainder of the evaluation will be completed by another provider, this initial triage assessment does not replace that evaluation, and the importance of remaining in the ED until their evaluation is complete.     Amanda Burnett A, PA-C 01/03/22 1855

## 2022-01-03 NOTE — ED Notes (Signed)
Pt caused for triage, no response

## 2022-01-03 NOTE — ED Triage Notes (Signed)
Pt reports she has a headache (generalized) since yesterday. She says she was seen for the same, but her head is still hurting. Denies n/v, blurry vision or dizziness. Unable to eat d/t the pain in her head.

## 2022-01-03 NOTE — Discharge Instructions (Addendum)
Seen in the ER for headaches and body aches.  The work-up in the emergency room that includes labs is normal.  It is unclear why you are having headaches.  Continue to take over-the-counter medications for symptom relief.  Please return to the ER if the headache gets severe and in not improving, you have associated new one sided numbness, tingling, weakness or confusion, seizures, poor balance or poor vision.

## 2022-01-03 NOTE — Discharge Instructions (Signed)
If you develop continued, recurrent, or worsening headache, fever, neck stiffness, vomiting, blurry or double vision, weakness or numbness in your arms or legs, trouble speaking, or any other new/concerning symptoms then return to the ER for evaluation.  

## 2022-01-03 NOTE — ED Triage Notes (Signed)
Pt states body aches and headaches for quite some time.  States she refused blood testing earlier because she's in a custody battle: "My mom is Kennyth Arnold and my dad is Dr Brooke Dare".  Seen earlier today and discharged.

## 2022-01-03 NOTE — ED Provider Notes (Signed)
[facility] EMERGENCY DEPARTMENT Provider Note   CSN: [id number] Arrival date & time: 01/02/22  2229     History  Chief Complaint  Patient presents with   Headache    Amanda Burnett is a 36 y.o. female.  HPI 36 year old female presents for evaluation of a headache.  She has had a progressively worsening headache for a couple days.  She denies fevers, neck pain or stiffness, or focal weakness.  No vomiting.  She does have a little bit of photophobia.  She was seen here yesterday and given oral medicines but did not have any relief.  Thought she would get better but her headache continues to worsen and so she presents here.  Originally when it started a couple days ago she states that it was mild in onset and has progressively worsened.  She is also complaining of diffuse body aches over a similar time.  Home Medications Prior to Admission medications   Medication Sig Start Date End Date Taking? Authorizing Provider  ARIPiprazole ER (ABILIFY MAINTENA) 400 MG SRER injection Inject 2 mLs (400 mg total) into the muscle every 28 (twenty-eight) days. 06/19/18   Johnn Hai, MD  benztropine (COGENTIN) 0.5 MG tablet Take 1 tablet (0.5 mg total) by mouth 2 (two) times daily. 06/19/18   Johnn Hai, MD  dicyclomine (BENTYL) 20 MG tablet Take 1 tablet (20 mg total) by mouth 2 (two) times daily. 12/31/21   Dorie Rank, MD  etodolac (LODINE) 300 MG capsule Take 1 capsule (300 mg total) by mouth every 8 (eight) hours as needed for pain 12/31/21   Dorie Rank, MD  meloxicam (MOBIC) 15 MG tablet Take 1 tablet (15 mg total) by mouth daily for 10 days. 12/31/21 01/11/22  Roemhildt, Lorin T, PA-C  risperiDONE (RISPERDAL) 3 MG tablet Take 1 tablet (3 mg total) by mouth 2 (two) times daily. 06/19/18   Johnn Hai, MD      Allergies    Patient has no known allergies.    Review of Systems   Review of Systems  Constitutional:  Negative for fever.  Gastrointestinal:  Negative for abdominal  pain, nausea and vomiting.  Musculoskeletal:  Positive for myalgias. Negative for neck pain and neck stiffness.  Neurological:  Positive for headaches. Negative for weakness and numbness.    Physical Exam Updated Vital Signs BP 110/73   Pulse 69   Temp 98.4 F (36.9 C) (Oral)   Resp 16   LMP 12/30/2021 (Exact Date) Comment: states she is on her period currently and not currently sexually active  SpO2 99%  Physical Exam Vitals and nursing note reviewed.  Constitutional:      General: She is not in acute distress.    Appearance: She is well-developed. She is not ill-appearing or diaphoretic.  HENT:     Head: Normocephalic and atraumatic.  Eyes:     Extraocular Movements: Extraocular movements intact.     Pupils: Pupils are equal, round, and reactive to light.  Cardiovascular:     Rate and Rhythm: Normal rate and regular rhythm.     Heart sounds: Normal heart sounds.  Pulmonary:     Effort: Pulmonary effort is normal.     Breath sounds: Normal breath sounds.  Abdominal:     Palpations: Abdomen is soft.     Tenderness: There is no abdominal tenderness.  Musculoskeletal:     Cervical back: Normal range of motion and neck supple.  Skin:    General: Skin is warm and  dry.  Neurological:     Mental Status: She is alert.     Comments: CN 3-12 grossly intact. 5/5 strength in all 4 extremities. Grossly normal sensation. Normal finger to nose.      ED Results / Procedures / Treatments   Labs (all labs ordered are listed, but only abnormal results are displayed) Labs Reviewed - No data to display  EKG None  Radiology No results found.  Procedures Procedures    Medications Ordered in ED Medications  prochlorperazine (COMPAZINE) tablet 10 mg (10 mg Oral Given 01/03/22 1045)  dexamethasone (DECADRON) tablet 10 mg (10 mg Oral Given 01/03/22 1045)  ibuprofen (ADVIL) tablet 800 mg (800 mg Oral Given 01/03/22 1046)  acetaminophen (TYLENOL) tablet 1,000 mg (1,000 mg Oral  Given 01/03/22 1045)    ED Course/ Medical Decision Making/ A&P                           Medical Decision Making Risk OTC drugs. Prescription drug management.   Chart reviewed.  Been in and out of the ED multiple times, primarily for abdominal pain the right now she is complaining of headache.  Her exam is unremarkable.  Highly doubt meningismus or other CNS emergency.  I do not think CNS imaging is needed.  I did offer to do IV meds and labs for headache.  However she declines as she really does not want to be stuck for either blood work or IV.  Her vitals are normal.  We did try oral meds and given this she is feeling a lot better and does not want any further treatment or work-up and feels ready for discharge.  I think this is reasonable.  She will be given return precautions.        Final Clinical Impression(s) / ED Diagnoses Final diagnoses:  Generalized headache    Rx / DC Orders ED Discharge Orders     None         Pricilla Loveless, MD 01/03/22 [phone]

## 2022-01-03 NOTE — ED Notes (Signed)
Patient denies pain and is resting comfortably.  

## 2022-01-04 ENCOUNTER — Encounter (HOSPITAL_COMMUNITY): Payer: Self-pay | Admitting: Emergency Medicine

## 2022-01-04 ENCOUNTER — Emergency Department (HOSPITAL_COMMUNITY): Payer: Medicaid Other

## 2022-01-04 ENCOUNTER — Emergency Department (HOSPITAL_COMMUNITY)
Admission: EM | Admit: 2022-01-04 | Discharge: 2022-01-04 | Disposition: A | Discharge status: Home / Self Care | Payer: Medicaid Other | Attending: Emergency Medicine | Admitting: Emergency Medicine

## 2022-01-04 DIAGNOSIS — R079 Chest pain, unspecified: Secondary | ICD-10-CM | POA: Insufficient documentation

## 2022-01-04 DIAGNOSIS — M791 Myalgia, unspecified site: Secondary | ICD-10-CM | POA: Insufficient documentation

## 2022-01-04 LAB — COMPREHENSIVE METABOLIC PANEL
ALT: 17 U/L (ref 0–44)
AST: 19 U/L (ref 15–41)
Albumin: 4.1 g/dL (ref 3.5–5.0)
Alkaline Phosphatase: 72 U/L (ref 38–126)
Anion gap: 8 (ref 5–15)
BUN: 10 mg/dL (ref 6–20)
CO2: 24 mmol/L (ref 22–32)
Calcium: 8.8 mg/dL — ABNORMAL LOW (ref 8.9–10.3)
Chloride: 105 mmol/L (ref 98–111)
Creatinine, Ser: 0.96 mg/dL (ref 0.44–1.00)
GFR, Estimated: >60 mL/min (ref >60)
Glucose, Bld: 96 mg/dL (ref 70–99)
Potassium: 3.3 mmol/L — ABNORMAL LOW (ref 3.5–5.1)
Sodium: 137 mmol/L (ref 135–145)
Total Bilirubin: 0.7 mg/dL (ref 0.3–1.2)
Total Protein: 7.2 g/dL (ref 6.5–8.1)

## 2022-01-04 LAB — CBC WITH DIFFERENTIAL/PLATELET
Abs Immature Granulocytes: 0.02 10*3/uL (ref 0.00–0.07)
Basophils Absolute: 0 10*3/uL (ref 0.0–0.1)
Basophils Relative: 1 %
Eosinophils Absolute: 0 10*3/uL (ref 0.0–0.5)
Eosinophils Relative: 0 %
HCT: 41.1 % (ref 36.0–46.0)
Hemoglobin: 14 g/dL (ref 12.0–15.0)
Immature Granulocytes: 0 %
Lymphocytes Relative: 36 %
Lymphs Abs: 3 10*3/uL (ref 0.7–4.0)
MCH: 31.5 pg (ref 26.0–34.0)
MCHC: 34.1 g/dL (ref 30.0–36.0)
MCV: 92.6 fL (ref 80.0–100.0)
Monocytes Absolute: 0.7 10*3/uL (ref 0.1–1.0)
Monocytes Relative: 8 %
Neutro Abs: 4.6 10*3/uL (ref 1.7–7.7)
Neutrophils Relative %: 55 %
Platelets: 297 10*3/uL (ref 150–400)
RBC: 4.44 MIL/uL (ref 3.87–5.11)
RDW: 13.1 % (ref 11.5–15.5)
WBC: 8.4 10*3/uL (ref 4.0–10.5)
nRBC: 0 % (ref 0.0–0.2)

## 2022-01-04 LAB — TROPONIN I (HIGH SENSITIVITY)
Troponin I (High Sensitivity): 3 ng/L (ref <18)
Troponin I (High Sensitivity): 2 ng/L (ref <18)

## 2022-01-04 NOTE — ED Provider Triage Note (Signed)
Emergency Medicine Provider Triage Evaluation Note  Amanda Burnett , a 36 y.o. female  was evaluated in triage.  Pt complains of chest pain, diffuse myalgias.  Patient reports symptoms present for the past 3 days with acute onset of chest pain occurring today.  Reports no associated shortness of breath.  Was seen emergency department yesterday and states that symptoms resolved with administration of Percocet.  She is currently questing readministration of Percocet.  Denies history of sickle cell pathology.  Denies fever, chills, night sweats, nausea, vomiting, recent trauma/falls..  Review of Systems  Positive: See above Negative:   Physical Exam  BP 108/82 (BP Location: Right Arm)   Pulse 70   Temp 98.1 F (36.7 C) (Oral)   Resp 14   Ht 5\' 6"  (1.676 m)   Wt 72 kg   LMP 12/30/2021 (Exact Date) Comment: states she is on her period currently and not currently sexually active  SpO2 100%   BMI 25.62 kg/m  Gen:   Awake, no distress   Resp:  Normal effort  MSK:   Moves extremities without difficulty  Other:  No obvious murmurs gallops or rubs.  Lungs clear to auscultation bilaterally.  No lower extremity edema noted.  No obvious signs of trauma to exposed areas of the legs.  Medical Decision Making  Medically screening exam initiated at 6:54 PM.  Appropriate orders placed.  Amanda Burnett was informed that the remainder of the evaluation will be completed by another provider, this initial triage assessment does not replace that evaluation, and the importance of remaining in the ED until their evaluation is complete.     [name], Peter Garter 01/04/22 [phone]

## 2022-01-04 NOTE — ED Provider Notes (Signed)
[facility] EMERGENCY DEPARTMENT Provider Note   CSN: [id number] Arrival date & time: 01/04/22  1631     History  No chief complaint on file.   [name] is a 36 y.o. female.  HPI Patient presents with chest pain and muscle pain.  Has been going for days now.  States has been going for a while.  Seen in the ER yesterday.  Reportedly had been relieved by Percocet.  Of [facility] and [facility] this is her 15th visit to the ER this month with today being the 20th.  Has been worked up for chronic abdominal pain and myalgias.  States she just hurts.  States the pain is mostly in her muscles and worse when she walks.    Home Medications Prior to Admission medications   Medication Sig Start Date End Date Taking? Authorizing Provider  ARIPiprazole ER (ABILIFY MAINTENA) 400 MG SRER injection Inject 2 mLs (400 mg total) into the muscle every 28 (twenty-eight) days. 06/19/18   Malvin Johns, MD  benztropine (COGENTIN) 0.5 MG tablet Take 1 tablet (0.5 mg total) by mouth 2 (two) times daily. 06/19/18   Malvin Johns, MD  dicyclomine (BENTYL) 20 MG tablet Take 1 tablet (20 mg total) by mouth 2 (two) times daily. 12/31/21   Linwood Dibbles, MD  etodolac (LODINE) 300 MG capsule Take 1 capsule (300 mg total) by mouth every 8 (eight) hours as needed for pain 12/31/21   Linwood Dibbles, MD  meloxicam (MOBIC) 15 MG tablet Take 1 tablet (15 mg total) by mouth daily for 10 days. 12/31/21 01/11/22  Roemhildt, Lorin T, PA-C  risperiDONE (RISPERDAL) 3 MG tablet Take 1 tablet (3 mg total) by mouth 2 (two) times daily. 06/19/18   Malvin Johns, MD      Allergies    Patient has no known allergies.    Review of Systems   Review of Systems  Physical Exam Updated Vital Signs BP 110/72 (BP Location: Right Arm)   Pulse 62   Temp 98.1 F (36.7 C) (Oral)   Resp 18   Ht 5\' 6"  (1.676 m)   Wt 72 kg   LMP 12/30/2021 (Exact Date) Comment: states she is on her period currently and not currently  sexually active  SpO2 100%   BMI 25.62 kg/m  Physical Exam Vitals and nursing note reviewed.  Constitutional:      Comments: Sitting in the hall wrapped up in blanket.  Chest:     Chest wall: No tenderness.  Abdominal:     Tenderness: There is no abdominal tenderness.  Musculoskeletal:     Comments: Potential mild muscle tenderness.  Neurological:     Mental Status: She is alert.     ED Results / Procedures / Treatments   Labs (all labs ordered are listed, but only abnormal results are displayed) Labs Reviewed  COMPREHENSIVE METABOLIC PANEL - Abnormal; Notable for the following components:      Result Value   Potassium 3.3 (*)    Calcium 8.8 (*)    All other components within normal limits  CBC WITH DIFFERENTIAL/PLATELET  TROPONIN I (HIGH SENSITIVITY)  TROPONIN I (HIGH SENSITIVITY)    EKG None  Radiology DG Chest 1 View  Result Date: 01/04/2022 CLINICAL DATA:  Chest pain EXAM: CHEST  1 VIEW COMPARISON:  12/30/2021 FINDINGS: The heart size and mediastinal contours are within normal limits. Both lungs are clear. The visualized skeletal structures are unremarkable. No pneumothorax. IMPRESSION: Normal study. Electronically Signed  By: Charlett Nose M.D.   On: 01/04/2022 19:44    Procedures Procedures    Medications Ordered in ED Medications - No data to display  ED Course/ Medical Decision Making/ A&P                           Medical Decision Making  Patient with pain.  Diffuse.  States his muscles.  Multiple visits for various pain complaints over the last month.  Reviewed work-up from yesterday.  Negative CK.  Lab work reassuring.  Lab work x-ray reassuring today.  Minimal hypokalemia does not have need aggressive supplementation.  Will discharge home with outpatient follow-up although if she is at high risk of returning.        Final Clinical Impression(s) / ED Diagnoses Final diagnoses:  Myalgia    Rx / DC Orders ED Discharge Orders     None          Benjiman Core, MD 01/04/22 2308

## 2022-01-04 NOTE — ED Triage Notes (Signed)
Pt endorses body aches. States she was here yesterday and given pain meds for her headache. Negative covid and flu yesterday.

## 2022-01-04 NOTE — ED Notes (Signed)
Patient is currently refusing to be d/c until she gets soda, crackers, and a [place] sandwich

## 2022-01-04 NOTE — Discharge Instructions (Signed)
Follow-up with the primary care doctor.

## 2022-01-05 ENCOUNTER — Emergency Department (HOSPITAL_COMMUNITY)
Admission: EM | Admit: 2022-01-05 | Discharge: 2022-01-05 | Disposition: A | Discharge status: Home / Self Care | Payer: Medicaid Other | Attending: Emergency Medicine | Admitting: Emergency Medicine

## 2022-01-05 DIAGNOSIS — G894 Chronic pain syndrome: Secondary | ICD-10-CM | POA: Insufficient documentation

## 2022-01-05 MED ORDER — OXYCODONE-ACETAMINOPHEN 5-325 MG PO TABS
1.0000 | ORAL_TABLET | Freq: Once | ORAL | Status: AC
  Administered 2022-01-05: 1 via ORAL
  Filled 2022-01-05: qty 1

## 2022-01-05 NOTE — Discharge Instructions (Addendum)
You were seen in the ER for leg pain and headache.  We gave you a dose of medication. It is incredibly important that you see a primary doctor. We in the ER cannot write prescriptions for narcotics for chronic issues.

## 2022-01-05 NOTE — ED Triage Notes (Signed)
Pt states she has had generalized body aches and a headache for a few days.

## 2022-01-05 NOTE — ED Provider Notes (Signed)
[facility]-EMERGENCY DEPT Provider Note   CSN: [id number] Arrival date & time: 01/05/22  1718     History  Chief Complaint  Patient presents with   Generalized Body Aches    Amanda Burnett is a 36 y.o. female with history of depression and anxiety who presents the emergency department complaining of body aches and headache for several days.  States the only thing that is helped her in the past is Percocet.  Not currently on any psychiatric medications.  HPI     Home Medications Prior to Admission medications   Medication Sig Start Date End Date Taking? Authorizing Provider  ARIPiprazole ER (ABILIFY MAINTENA) 400 MG SRER injection Inject 2 mLs (400 mg total) into the muscle every 28 (twenty-eight) days. 06/19/18   Malvin Johns, MD  benztropine (COGENTIN) 0.5 MG tablet Take 1 tablet (0.5 mg total) by mouth 2 (two) times daily. 06/19/18   Malvin Johns, MD  dicyclomine (BENTYL) 20 MG tablet Take 1 tablet (20 mg total) by mouth 2 (two) times daily. 12/31/21   Linwood Dibbles, MD  etodolac (LODINE) 300 MG capsule Take 1 capsule (300 mg total) by mouth every 8 (eight) hours as needed for pain 12/31/21   Linwood Dibbles, MD  meloxicam (MOBIC) 15 MG tablet Take 1 tablet (15 mg total) by mouth daily for 10 days. 12/31/21 01/11/22  Brenner Visconti T, PA-C  risperiDONE (RISPERDAL) 3 MG tablet Take 1 tablet (3 mg total) by mouth 2 (two) times daily. 06/19/18   Malvin Johns, MD      Allergies    Patient has no known allergies.    Review of Systems   Review of Systems  Musculoskeletal:  Positive for myalgias.  Neurological:  Positive for headaches.  All other systems reviewed and are negative.   Physical Exam Updated Vital Signs BP 106/76 (BP Location: Left Arm)   Pulse 88   Temp 98.4 F (36.9 C) (Oral)   Resp 16   Ht 5\' 6"  (1.676 m)   Wt 73 kg   LMP 12/30/2021 (Exact Date) Comment: states she is on her period currently and not currently sexually active  SpO2 100%   BMI  25.99 kg/m  Physical Exam Vitals and nursing note reviewed.  Constitutional:      Appearance: Normal appearance.  HENT:     Head: Normocephalic and atraumatic.  Eyes:     Conjunctiva/sclera: Conjunctivae normal.  Pulmonary:     Effort: Pulmonary effort is normal. No respiratory distress.  Skin:    General: Skin is warm and dry.  Neurological:     Mental Status: She is alert.  Psychiatric:        Mood and Affect: Mood normal.        Behavior: Behavior normal.     ED Results / Procedures / Treatments   Labs (all labs ordered are listed, but only abnormal results are displayed) Labs Reviewed - No data to display  EKG None  Radiology DG Chest 1 View  Result Date: 01/04/2022 CLINICAL DATA:  Chest pain EXAM: CHEST  1 VIEW COMPARISON:  12/30/2021 FINDINGS: The heart size and mediastinal contours are within normal limits. Both lungs are clear. The visualized skeletal structures are unremarkable. No pneumothorax. IMPRESSION: Normal study. Electronically Signed   By: 01/01/2022 M.D.   On: 01/04/2022 19:44    Procedures Procedures    Medications Ordered in ED Medications  oxyCODONE-acetaminophen (PERCOCET/ROXICET) 5-325 MG per tablet 1 tablet (1 tablet Oral Given 01/05/22 2059)  ED Course/ Medical Decision Making/ A&P                           Medical Decision Making Risk Prescription drug management.   Patient is a 36 year old female with a history of anxiety and depression who presents the emergency department complaining of generalized body aches and headache.  States the only thing that has helped her is Percocet.  Upon chart review this is patient's 16th ER visit for similar symptoms.  She states that she is currently in a court battle with her parents and that is why she cannot follow-up with a primary doctor to get any prescriptions filled. Had full blood work done on 11/19 with normal workup.   Denies any new injury. Denies any illness. Do not think she's  requiring repeat lab work or imaging.  Give one tablet of pain medication. Explained that we cannot give narcotic prescriptions for chronic pain. Given [facility] for follow up.   Final Clinical Impression(s) / ED Diagnoses Final diagnoses:  Chronic pain syndrome    Rx / DC Orders ED Discharge Orders     None      Portions of this report may have been transcribed using voice recognition software. Every effort was made to ensure accuracy; however, inadvertent computerized transcription errors may be present.    Jeanella Flattery 01/05/22 2240    Linwood Dibbles, MD 01/06/22 [phone]

## 2022-01-08 ENCOUNTER — Encounter (HOSPITAL_COMMUNITY): Payer: Self-pay

## 2022-01-08 ENCOUNTER — Other Ambulatory Visit: Payer: Self-pay

## 2022-01-08 ENCOUNTER — Emergency Department (HOSPITAL_COMMUNITY)
Admission: EM | Admit: 2022-01-08 | Discharge: 2022-01-08 | Disposition: A | Discharge status: Home / Self Care | Payer: Medicaid Other | Attending: Student | Admitting: Student

## 2022-01-08 ENCOUNTER — Emergency Department (HOSPITAL_COMMUNITY): Payer: Medicaid Other

## 2022-01-08 DIAGNOSIS — N9489 Other specified conditions associated with female genital organs and menstrual cycle: Secondary | ICD-10-CM | POA: Insufficient documentation

## 2022-01-08 DIAGNOSIS — G894 Chronic pain syndrome: Secondary | ICD-10-CM | POA: Insufficient documentation

## 2022-01-08 DIAGNOSIS — R0602 Shortness of breath: Secondary | ICD-10-CM | POA: Insufficient documentation

## 2022-01-08 DIAGNOSIS — Z760 Encounter for issue of repeat prescription: Secondary | ICD-10-CM | POA: Insufficient documentation

## 2022-01-08 DIAGNOSIS — R079 Chest pain, unspecified: Secondary | ICD-10-CM | POA: Insufficient documentation

## 2022-01-08 DIAGNOSIS — R519 Headache, unspecified: Secondary | ICD-10-CM | POA: Insufficient documentation

## 2022-01-08 LAB — CBC WITH DIFFERENTIAL/PLATELET
Abs Immature Granulocytes: 0.01 10*3/uL (ref 0.00–0.07)
Basophils Absolute: 0 10*3/uL (ref 0.0–0.1)
Basophils Relative: 1 %
Eosinophils Absolute: 0.1 10*3/uL (ref 0.0–0.5)
Eosinophils Relative: 3 %
HCT: 42 % (ref 36.0–46.0)
Hemoglobin: 13.7 g/dL (ref 12.0–15.0)
Immature Granulocytes: 0 %
Lymphocytes Relative: 43 %
Lymphs Abs: 2.3 10*3/uL (ref 0.7–4.0)
MCH: 30.9 pg (ref 26.0–34.0)
MCHC: 32.6 g/dL (ref 30.0–36.0)
MCV: 94.8 fL (ref 80.0–100.0)
Monocytes Absolute: 0.4 10*3/uL (ref 0.1–1.0)
Monocytes Relative: 7 %
Neutro Abs: 2.4 10*3/uL (ref 1.7–7.7)
Neutrophils Relative %: 46 %
Platelets: 285 10*3/uL (ref 150–400)
RBC: 4.43 MIL/uL (ref 3.87–5.11)
RDW: 13.2 % (ref 11.5–15.5)
WBC: 5.3 10*3/uL (ref 4.0–10.5)
nRBC: 0 % (ref 0.0–0.2)

## 2022-01-08 LAB — BASIC METABOLIC PANEL
Anion gap: 4 — ABNORMAL LOW (ref 5–15)
BUN: 9 mg/dL (ref 6–20)
CO2: 25 mmol/L (ref 22–32)
Calcium: 9.3 mg/dL (ref 8.9–10.3)
Chloride: 108 mmol/L (ref 98–111)
Creatinine, Ser: 0.83 mg/dL (ref 0.44–1.00)
GFR, Estimated: >60 mL/min (ref >60)
Glucose, Bld: 88 mg/dL (ref 70–99)
Potassium: 3.6 mmol/L (ref 3.5–5.1)
Sodium: 137 mmol/L (ref 135–145)

## 2022-01-08 LAB — I-STAT BETA HCG BLOOD, ED (MC, WL, AP ONLY): I-stat hCG, quantitative: <5 m[IU]/mL (ref <5)

## 2022-01-08 LAB — TROPONIN I (HIGH SENSITIVITY): Troponin I (High Sensitivity): <2 ng/L (ref <18)

## 2022-01-08 MED ORDER — BUTALBITAL-APAP-CAFFEINE 50-325-40 MG PO TABS
2.0000 | ORAL_TABLET | Freq: Once | ORAL | Status: AC
  Administered 2022-01-08: 2 via ORAL
  Filled 2022-01-08: qty 2

## 2022-01-08 NOTE — ED Provider Triage Note (Signed)
Emergency Medicine Provider Triage Evaluation Note  Amanda Burnett , a 36 y.o. female  was evaluated in triage.  Pt complains of Medication refill and chest pain and shortness of breath. Patient seen yesterday in [place] and prescribed Fioricet and she has been unable to refill due to financial restraints.  Patient also admits to chest pain and shortness of breath.  Review of Systems  Positive: CP, SOB Negative: fever  Physical Exam  BP 119/77 (BP Location: Left Arm)   Pulse 82   Temp 97.8 F (36.6 C) (Oral)   Resp 16   LMP 12/30/2021 (Exact Date) Comment: states she is on her period currently and not currently sexually active  SpO2 100%  Gen:   Awake, no distress   Resp:  Normal effort  MSK:   Moves extremities without difficulty  Other:    Medical Decision Making  Medically screening exam initiated at 3:50 PM.  Appropriate orders placed.  Coryn Mosso was informed that the remainder of the evaluation will be completed by another provider, this initial triage assessment does not replace that evaluation, and the importance of remaining in the ED until their evaluation is complete.  Cardiac labs [facility] consulted for medication assistance   Jesusita Oka 01/08/22 1551

## 2022-01-08 NOTE — ED Provider Notes (Signed)
[facility]-EMERGENCY DEPT Provider Note   CSN: [id number] Arrival date & time: 01/08/22  1516     History  Chief Complaint  Patient presents with   Medication Refill   Shortness of Breath   Chest Pain    Amanda Burnett is a 36 y.o. female.  Patient with history of anxiety and paranoid schizophrenia presents today requesting a medication refill, headache, chest pain, and shortness of breath. Patietn states that these symptoms have been ongoing for the past several weeks. Patient was seen yesterday at [facility] for management of same and had unremarkable work-up and was prescribed Fioricet for management of her symptoms. Patient states that she has no money and is therefore unable to fill this prescription. Patient denies any fevers, chills, vision changes, neck pain, cough, congestion, nausea, vomiting, diarrhea, or abdominal pain. Patient denies any changes in symptoms since she was seen yesterday. No recent travel, recent surgeries, OCP use, hx blood clots.      The history is provided by the patient. No language interpreter was used.  Medication Refill Shortness of Breath Associated symptoms: chest pain   Chest Pain Associated symptoms: shortness of breath        Home Medications Prior to Admission medications   Medication Sig Start Date End Date Taking? Authorizing Provider  ARIPiprazole ER (ABILIFY MAINTENA) 400 MG SRER injection Inject 2 mLs (400 mg total) into the muscle every 28 (twenty-eight) days. 06/19/18   Malvin Johns, MD  benztropine (COGENTIN) 0.5 MG tablet Take 1 tablet (0.5 mg total) by mouth 2 (two) times daily. 06/19/18   Malvin Johns, MD  dicyclomine (BENTYL) 20 MG tablet Take 1 tablet (20 mg total) by mouth 2 (two) times daily. 12/31/21   Linwood Dibbles, MD  etodolac (LODINE) 300 MG capsule Take 1 capsule (300 mg total) by mouth every 8 (eight) hours as needed for pain 12/31/21   Linwood Dibbles, MD  meloxicam (MOBIC) 15 MG tablet Take  1 tablet (15 mg total) by mouth daily for 10 days. 12/31/21 01/11/22  Roemhildt, Lorin T, PA-C  risperiDONE (RISPERDAL) 3 MG tablet Take 1 tablet (3 mg total) by mouth 2 (two) times daily. 06/19/18   Malvin Johns, MD      Allergies    Patient has no known allergies.    Review of Systems   Review of Systems  Respiratory:  Positive for shortness of breath.   Cardiovascular:  Positive for chest pain.  All other systems reviewed and are negative.   Physical Exam Updated Vital Signs BP 103/66   Pulse 76   Temp 97.8 F (36.6 C) (Oral)   Resp 16   Ht 5\' 6"  (1.676 m)   Wt 73 kg   LMP 12/30/2021 (Exact Date) Comment: states she is on her period currently and not currently sexually active  SpO2 100%   BMI 25.99 kg/m  Physical Exam Vitals and nursing note reviewed.  Constitutional:      General: She is not in acute distress.    Appearance: Normal appearance. She is well-developed and normal weight. She is not ill-appearing, toxic-appearing or diaphoretic.     Comments: Patient resting comfortably lying flat on her stomach in bed in no acute distress  HENT:     Head: Normocephalic and atraumatic.  Cardiovascular:     Rate and Rhythm: Normal rate and regular rhythm.     Heart sounds: Normal heart sounds.  Pulmonary:     Effort: Pulmonary effort is normal. No respiratory distress.  Breath sounds: Normal breath sounds.  Abdominal:     Palpations: Abdomen is soft.  Musculoskeletal:        General: Normal range of motion.     Cervical back: Normal range of motion.     Right lower leg: No tenderness. No edema.     Left lower leg: No tenderness. No edema.  Skin:    General: Skin is warm and dry.  Neurological:     General: No focal deficit present.     Mental Status: She is alert.  Psychiatric:        Mood and Affect: Mood normal.        Behavior: Behavior normal.    ED Results / Procedures / Treatments   Labs (all labs ordered are listed, but only abnormal results are  displayed) Labs Reviewed  BASIC METABOLIC PANEL - Abnormal; Notable for the following components:      Result Value   Anion gap 4 (*)    All other components within normal limits  CBC WITH DIFFERENTIAL/PLATELET  I-STAT BETA HCG BLOOD, ED ([facility], [facility], [facility] ONLY)  TROPONIN I (HIGH SENSITIVITY)  TROPONIN I (HIGH SENSITIVITY)    EKG None  Radiology DG Chest 1 View  Result Date: 01/08/2022 CLINICAL DATA:  Shortness of breath, chest pain EXAM: CHEST  1 VIEW COMPARISON:  01/04/2022 FINDINGS: The heart size and mediastinal contours are within normal limits. Both lungs are clear. The visualized skeletal structures are unremarkable. IMPRESSION: No acute abnormality of the lungs. Electronically Signed   By: Jearld Lesch M.D.   On: 01/08/2022 16:17    Procedures Procedures    Medications Ordered in ED Medications  butalbital-acetaminophen-caffeine (FIORICET) 50-325-40 MG per tablet 2 tablet (has no administration in time range)    ED Course/ Medical Decision Making/ A&P                           Medical Decision Making Risk Prescription drug management.   Patient presents today requesting medication. She also endorses headache, chest pain, and shortness of breath.  She is afebrile, nontoxic-appearing, and in no acute distress with reassuring vital signs.  Physical exam reassuring.  Laboratory evaluation obtained and reveals no acute findings, specifically no leukocytosis anemia electrolyte abnormalities or troponin elevation.  X-ray imaging obtained which shows no acute abnormality.  I have personally reviewed and interpreted this imaging and agree with radiology interpretation.   After evaluating all of the data points in this case, the presentation of Amanda Burnett is NOT consistent with Acute Coronary Syndrome (ACS) and/or myocardial ischemia, pulmonary embolism (PERC negative), aortic dissection; Borhaave's, significant arrythmia, pneumothorax, cardiac tamponade, or other emergent  cardiopulmonary condition.  Further, the presentation of Amanda Burnett is NOT consistent with pericarditis, myocarditis, pancreatitis, mediastinitis, endocarditis, new valvular disease.  Additionally, the presentation of Amanda Jacksonis NOT consistent with flail chest, cardiac contusion, ARDS, or significant intra-thoracic bleeding.  Moreover, this presentation is NOT consistent with pneumonia, sepsis, or pyelonephritis.  The patient has a   Heart Score 0.  She is also PERC negative.  Upon chart review, this is patients [facility] ED visit this [facility] with similar complaints all with unremarkable work-ups. Patient given a dose of her Fioricet per her request with subjective improvement in symptoms, explained that we do not give narcotic prescriptions for chronic pain.  No further emergent concerns, patient is stable to be discharged.  Patient understanding and amenable with plan, educated on  red flag symptoms that would prompt immediate return. Patient discharged in stable condition.   Strict return and follow-up precautions have been given by me personally or by detailed written instruction given verbally by nursing staff using the teach back method to the patient/family/caregiver(s).  Data Reviewed/Counseling: I have reviewed the patient's vital signs, nursing notes, and other relevant tests/information. I had a detailed discussion regarding the historical points, exam findings, and any diagnostic results supporting the discharge diagnosis. I also discussed the need for outpatient follow-up and the need to return to the ED if symptoms worsen or if there are any questions or concerns that arise at home.   Final Clinical Impression(s) / ED Diagnoses Final diagnoses:  Chronic pain syndrome    Rx / DC Orders ED Discharge Orders     None     An After Visit Summary was printed and given to the patient.     Vear Clock 01/08/22 2202    Glendora Score,  MD 01/09/22 1220

## 2022-01-08 NOTE — ED Triage Notes (Signed)
Patient reports that she had a headache, knee pain that started 2 days ago. Patient states she received a prescription at [facility] ED yesterday and states she is unable to afford the medicine. (Fioricet)  Today, the patient c/o chest pain and SOB that started today at 0500 today.

## 2022-01-08 NOTE — Discharge Instructions (Signed)
Follow up with the [facility] for your chronic health needs as you have been instructed previously.   Return if development of any new or worsening symptoms

## 2022-01-09 ENCOUNTER — Other Ambulatory Visit: Payer: Self-pay

## 2022-01-09 ENCOUNTER — Encounter (HOSPITAL_COMMUNITY): Payer: Self-pay | Admitting: Emergency Medicine

## 2022-01-09 ENCOUNTER — Emergency Department (HOSPITAL_COMMUNITY)
Admission: EM | Admit: 2022-01-09 | Discharge: 2022-01-09 | Disposition: A | Discharge status: Home / Self Care | Payer: Medicaid Other | Attending: Emergency Medicine | Admitting: Emergency Medicine

## 2022-01-09 DIAGNOSIS — G4489 Other headache syndrome: Secondary | ICD-10-CM | POA: Insufficient documentation

## 2022-01-09 MED ORDER — HYDROCODONE-ACETAMINOPHEN 5-325 MG PO TABS
1.0000 | ORAL_TABLET | Freq: Once | ORAL | Status: AC
  Administered 2022-01-09: 1 via ORAL
  Filled 2022-01-09: qty 1

## 2022-01-09 NOTE — ED Provider Notes (Signed)
[facility] EMERGENCY DEPARTMENT Provider Note   CSN: [id number] Arrival date & time: 01/09/22  1722     History  Chief Complaint  Patient presents with   Headache    Amanda Burnett is a 36 y.o. female.  The history is provided by the patient.   Patient presents for headache.  She reports has been going on for "a few days " No falls or head injury.  No fevers or vomiting.  No focal weakness.  No visual changes.  She was seen in outside hospital recently and given prescriptions but is unable to fill them.  She reports she has not been able to follow-up with her primary care physician. No previous history of CVA.  She denies any medical conditions   Past Medical History:  Diagnosis Date   Anxiety    Depression     Home Medications Prior to Admission medications   Medication Sig Start Date End Date Taking? Authorizing Provider  ARIPiprazole ER (ABILIFY MAINTENA) 400 MG SRER injection Inject 2 mLs (400 mg total) into the muscle every 28 (twenty-eight) days. 06/19/18   Malvin Johns, MD  benztropine (COGENTIN) 0.5 MG tablet Take 1 tablet (0.5 mg total) by mouth 2 (two) times daily. 06/19/18   Malvin Johns, MD  dicyclomine (BENTYL) 20 MG tablet Take 1 tablet (20 mg total) by mouth 2 (two) times daily. 12/31/21   Linwood Dibbles, MD  etodolac (LODINE) 300 MG capsule Take 1 capsule (300 mg total) by mouth every 8 (eight) hours as needed for pain 12/31/21   Linwood Dibbles, MD  meloxicam (MOBIC) 15 MG tablet Take 1 tablet (15 mg total) by mouth daily for 10 days. 12/31/21 01/11/22  Roemhildt, Lorin T, PA-C  risperiDONE (RISPERDAL) 3 MG tablet Take 1 tablet (3 mg total) by mouth 2 (two) times daily. 06/19/18   Malvin Johns, MD      Allergies    Patient has no known allergies.    Review of Systems   Review of Systems  Constitutional:  Negative for fever.  HENT:  Negative for hearing loss.   Eyes:  Negative for visual disturbance.  Cardiovascular:  Negative for chest pain.   Gastrointestinal:  Negative for abdominal pain.  Neurological:  Positive for headaches. Negative for dizziness, weakness and numbness.    Physical Exam Updated Vital Signs BP 119/70   Pulse 79   Temp 98.1 F (36.7 C) (Oral)   Resp 17   LMP 12/30/2021 (Exact Date) Comment: states she is on her period currently and not currently sexually active  SpO2 98%  Physical Exam CONSTITUTIONAL: Well developed/well nourished, resting comfortably HEAD: Normocephalic/atraumatic EYES: EOMI/PERRL, no nystagmus, no ptosis ENMT: Mucous membranes moist NECK: supple no meningeal signs SPINE/BACK:entire spine nontender CV: S1/S2 noted, no murmurs/rubs/gallops noted LUNGS: Lungs are clear to auscultation bilaterally, no apparent distress ABDOMEN: soft, nontender, no rebound or guarding GU:no cva tenderness NEURO:Awake/alert, face symmetric, no arm or leg drift is noted Equal 5/5 strength with shoulder abduction, elbow flex/extension, wrist flex/extension in upper extremities and equal hand grips bilaterally Equal 5/5 strength with hip flexion,knee flex/extension, foot dorsi/plantar flexion Cranial nerves 3/4/5/6/08/23/08/11/12 tested and intact Gait normal without ataxia No past pointing Sensation to light touch intact in all extremities EXTREMITIES: pulses normal, full ROM SKIN: warm, color normal PSYCH: no abnormalities of mood noted, alert and oriented to situation  ED Results / Procedures / Treatments   Labs (all labs ordered are listed, but only abnormal results are displayed) Labs Reviewed - No  data to display  EKG None  Radiology DG Chest 1 View  Result Date: 01/08/2022 CLINICAL DATA:  Shortness of breath, chest pain EXAM: CHEST  1 VIEW COMPARISON:  01/04/2022 FINDINGS: The heart size and mediastinal contours are within normal limits. Both lungs are clear. The visualized skeletal structures are unremarkable. IMPRESSION: No acute abnormality of the lungs. Electronically Signed   By:  Delanna Ahmadi M.D.   On: 01/08/2022 16:17    Procedures Procedures    Medications Ordered in ED Medications  HYDROcodone-acetaminophen (NORCO/VICODIN) 5-325 MG per tablet 1 tablet (1 tablet Oral Given 01/09/22 2332)    ED Course/ Medical Decision Making/ A&P                           Medical Decision Making Risk Prescription drug management.   This patient presents to the ED for concern of headache, this involves an extensive number of treatment options, and is a complaint that carries with it a high risk of complications and morbidity.  The differential diagnosis includes but is not limited to subarachnoid hemorrhage, intracranial hemorrhage, meningitis, encephalitis, CVST, temporal arteritis, idiopathic intracranial hypertension, migraine    Comorbidities that complicate the patient evaluation: Patient's presentation is complicated by their history of psychotic disorder  Social Determinants of Health: Patient's impaired access to primary care  increases the complexity of managing their presentation  Additional history obtained: Records reviewed Care Everywhere/External Records   Medicines ordered and prescription drug management: I ordered medication including vicodin  for pain   Test Considered: Considered neuroimaging, but patient is had CT head within the past year that was negative per Care Everywhere  Complexity of problems addressed: Patient's presentation is most consistent with  acute presentation with potential threat to life or bodily function  Disposition: After consideration of the diagnostic results and the patient's response to treatment,  I feel that the patent would benefit from discharge   .    Patient has had 10 ER in past 6 months to this institution and been seen at outside hospitals.  She has had previous neuroimaging is negative Her main concern is unable to get her prescriptions filled.  Headache has been ongoing for several days and was  gradual.  I have low suspicion for acute neurologic emergency. Her exam is unremarkable. She was given referral for outpatient management       Final Clinical Impression(s) / ED Diagnoses Final diagnoses:  Other headache syndrome    Rx / DC Orders ED Discharge Orders     None         Ripley Fraise, MD 01/09/22 2356

## 2022-01-09 NOTE — ED Triage Notes (Signed)
Patient here with a headache that has been ongoing for 10 days. Patient endorses receiving a prescription from [facility] and was given a prescription (fioricet) but the medication is too expensive to fill.

## 2022-01-09 NOTE — Discharge Instructions (Signed)

## 2022-01-11 ENCOUNTER — Encounter (HOSPITAL_COMMUNITY): Payer: Self-pay

## 2022-01-11 ENCOUNTER — Emergency Department (HOSPITAL_COMMUNITY)
Admission: EM | Admit: 2022-01-11 | Discharge: 2022-01-11 | Disposition: A | Discharge status: Home / Self Care | Payer: Medicaid Other | Attending: Emergency Medicine | Admitting: Emergency Medicine

## 2022-01-11 ENCOUNTER — Other Ambulatory Visit: Payer: Self-pay

## 2022-01-11 DIAGNOSIS — M25561 Pain in right knee: Secondary | ICD-10-CM | POA: Insufficient documentation

## 2022-01-11 DIAGNOSIS — G8929 Other chronic pain: Secondary | ICD-10-CM

## 2022-01-11 DIAGNOSIS — F22 Delusional disorders: Secondary | ICD-10-CM | POA: Insufficient documentation

## 2022-01-11 MED ORDER — OXYCODONE-ACETAMINOPHEN 5-325 MG PO TABS
1.0000 | ORAL_TABLET | Freq: Once | ORAL | Status: AC
  Administered 2022-01-11: 1 via ORAL
  Filled 2022-01-11: qty 1

## 2022-01-11 NOTE — Discharge Instructions (Addendum)
You were seen in the ER today for headaches and right knee pain. A dose of Percocet was given while you were in today but we discussed that we would be unable to provide a long-term prescription for home use at this time. Please make sure to follow up with a PCP or a community care clinic to establish care for better long-term management of pain.
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
## 2022-01-11 NOTE — ED Triage Notes (Signed)
Patient said she has a headache and right knee pain for a few days. Patient said she was prescribed Fioricet but it was giving her diarrhea.

## 2022-01-12 DIAGNOSIS — Z5900 Homelessness unspecified: Secondary | ICD-10-CM | POA: Insufficient documentation

## 2022-01-12 DIAGNOSIS — R519 Headache, unspecified: Secondary | ICD-10-CM | POA: Insufficient documentation

## 2022-01-13 ENCOUNTER — Emergency Department (HOSPITAL_COMMUNITY)
Admission: EM | Admit: 2022-01-13 | Discharge: 2022-01-13 | Disposition: A | Discharge status: Home / Self Care | Payer: Medicaid Other | Attending: Emergency Medicine | Admitting: Emergency Medicine

## 2022-01-13 ENCOUNTER — Encounter (HOSPITAL_COMMUNITY): Payer: Self-pay

## 2022-01-13 ENCOUNTER — Other Ambulatory Visit: Payer: Self-pay

## 2022-01-13 DIAGNOSIS — R519 Headache, unspecified: Secondary | ICD-10-CM | POA: Insufficient documentation

## 2022-01-13 DIAGNOSIS — Z5900 Homelessness unspecified: Secondary | ICD-10-CM | POA: Insufficient documentation

## 2022-01-13 DIAGNOSIS — G8929 Other chronic pain: Secondary | ICD-10-CM | POA: Insufficient documentation

## 2022-01-13 MED ORDER — IBUPROFEN 200 MG PO TABS
400.0000 mg | ORAL_TABLET | Freq: Once | ORAL | Status: AC | PRN
  Administered 2022-01-13: 400 mg via ORAL
  Filled 2022-01-13: qty 2

## 2022-01-13 MED ORDER — SUMATRIPTAN SUCCINATE 50 MG PO TABS
50.0000 mg | ORAL_TABLET | Freq: Once | ORAL | Status: AC
  Administered 2022-01-13: 50 mg via ORAL
  Filled 2022-01-13 (×2): qty 1

## 2022-01-13 MED ORDER — ACETAMINOPHEN 325 MG PO TABS
650.0000 mg | ORAL_TABLET | Freq: Once | ORAL | Status: AC
  Administered 2022-01-13: 650 mg via ORAL
  Filled 2022-01-13: qty 2

## 2022-01-13 MED ORDER — SUMATRIPTAN SUCCINATE 50 MG PO TABS
50.0000 mg | ORAL_TABLET | ORAL | 0 refills | Status: DC | PRN
  Filled 2022-01-13: qty 10, 1d supply, fill #0

## 2022-01-13 MED ORDER — SUMATRIPTAN SUCCINATE 6 MG/0.5ML ~~LOC~~ SOLN
6.0000 mg | Freq: Once | SUBCUTANEOUS | Status: DC
  Filled 2022-01-13: qty 0.5

## 2022-01-13 MED ORDER — METOCLOPRAMIDE HCL 10 MG PO TABS
10.0000 mg | ORAL_TABLET | Freq: Once | ORAL | Status: AC
  Administered 2022-01-13: 10 mg via ORAL
  Filled 2022-01-13: qty 1

## 2022-01-13 MED ORDER — SUMATRIPTAN SUCCINATE 50 MG PO TABS
50.0000 mg | ORAL_TABLET | ORAL | 0 refills | Status: AC | PRN
  Filled 2022-01-13: qty 10, 30d supply, fill #0

## 2022-01-13 MED ORDER — IBUPROFEN 200 MG PO TABS
600.0000 mg | ORAL_TABLET | Freq: Once | ORAL | Status: AC
  Administered 2022-01-13: 600 mg via ORAL
  Filled 2022-01-13: qty 3

## 2022-01-13 NOTE — ED Triage Notes (Addendum)
Patient reports under a lot of stress with a trial and family things and has been suffering from severe headaches.  Patient was taking fiorcet but it gave her diarrhea.  Patient reports went to PCP and was tested for covid which was negative and they gave her tylenol motrin and reglan with no relief. Denies SI HI  I believe patient maybe having a manic episode.  Reports she is going through a trial with clients and if she wins she will be rich and also reports she is training for the olympics and she has to beat the girl ahead of her.  Patient requested hydrocodone which I advised we do not give narcotics for chronic pain.

## 2022-01-13 NOTE — ED Provider Notes (Signed)
[facility]-EMERGENCY DEPT Provider Note   CSN: [id number] Arrival date & time: 01/13/22  1721     History  Chief Complaint  Patient presents with   Headache    Amanda Burnett is a 36 y.o. female.   Headache   36 year old female presents emergency department with complaints of headache.  Patient states that headache is similar to other headaches she has had in the past.  She states she is taken at home Tylenol/Motrin for headache which has not helped.  She is saying that "the only thing that helps this hydrocodone or oxycodone."  Was seen at [facility] long earlier today as well as [facility] and did not receive opiate medication presents emergency department for the same complaint.  Denies visual disturbance, gait abnormalities, weakness/sensory deficits in upper or lower extremities.  Headache is described as "all over."  History of homelessness, paranoid schizophrenia, generalized anxiety disorder, chronic headaches.  Home Medications Prior to Admission medications   Medication Sig Start Date End Date Taking? Authorizing Provider  ARIPiprazole ER (ABILIFY MAINTENA) 400 MG SRER injection Inject 2 mLs (400 mg total) into the muscle every 28 (twenty-eight) days. 06/19/18   Malvin Johns, MD  benztropine (COGENTIN) 0.5 MG tablet Take 1 tablet (0.5 mg total) by mouth 2 (two) times daily. 06/19/18   Malvin Johns, MD  dicyclomine (BENTYL) 20 MG tablet Take 1 tablet (20 mg total) by mouth 2 (two) times daily. 12/31/21   Linwood Dibbles, MD  etodolac (LODINE) 300 MG capsule Take 1 capsule (300 mg total) by mouth every 8 (eight) hours as needed for pain 12/31/21   Linwood Dibbles, MD  risperiDONE (RISPERDAL) 3 MG tablet Take 1 tablet (3 mg total) by mouth 2 (two) times daily. 06/19/18   Malvin Johns, MD  SUMAtriptan (IMITREX) 50 MG tablet Take 1 tablet (50 mg total) by mouth every 2 (two) hours as needed for migraine (Repeat dose only wants.  Please do not take more than 2 tablets in  24 hours.). May repeat in 2 hours if headache persists or recurs. 01/13/22   Peter Garter, PA      Allergies    Patient has no known allergies.    Review of Systems   Review of Systems  Neurological:  Positive for headaches.  All other systems reviewed and are negative.   Physical Exam Updated Vital Signs BP 110/81   Pulse 80   Temp 98 F (36.7 C)   Resp 16   Ht 5\' 6"  (1.676 m)   Wt 73 kg   LMP 12/30/2021 (Exact Date) Comment: states she is on her period currently and not currently sexually active  SpO2 99%   BMI 25.98 kg/m  Physical Exam Vitals and nursing note reviewed.  Constitutional:      General: She is not in acute distress.    Appearance: She is well-developed.  HENT:     Head: Normocephalic and atraumatic.  Eyes:     Conjunctiva/sclera: Conjunctivae normal.  Cardiovascular:     Rate and Rhythm: Normal rate and regular rhythm.     Heart sounds: No murmur heard. Pulmonary:     Effort: Pulmonary effort is normal. No respiratory distress.     Breath sounds: Normal breath sounds.  Abdominal:     Palpations: Abdomen is soft.     Tenderness: There is no abdominal tenderness.  Musculoskeletal:        General: No swelling.     Cervical back: Neck supple.  Skin:  General: Skin is warm and dry.     Capillary Refill: Capillary refill takes less than 2 seconds.  Neurological:     Mental Status: She is alert.     Comments: Alert and oriented to self, place, time and event.   Speech is fluent, clear without dysarthria or dysphasia.   Strength 5/5 in upper/lower extremities   Sensation intact in upper/lower extremities   Normal gait.  Negative Romberg. No pronator drift.  Normal finger-to-nose and feet tapping.  CN I not tested  CN II grossly intact visual fields bilaterally. Did not visualize posterior eye.  CN III, IV, VI PERRLA and EOMs intact bilaterally  CN V Intact sensation to sharp and light touch to the face  CN VII facial movements symmetric   CN VIII not tested  CN IX, X no uvula deviation, symmetric rise of soft palate  CN XI 5/5 SCM and trapezius strength bilaterally  CN XII Midline tongue protrusion, symmetric L/R movements   Psychiatric:        Mood and Affect: Mood normal.     ED Results / Procedures / Treatments   Labs (all labs ordered are listed, but only abnormal results are displayed) Labs Reviewed - No data to display  EKG None  Radiology No results found.  Procedures Procedures    Medications Ordered in ED Medications  SUMAtriptan (IMITREX) tablet 50 mg (50 mg Oral Given 01/13/22 1947)  ibuprofen (ADVIL) tablet 600 mg (600 mg Oral Given 01/13/22 2133)  acetaminophen (TYLENOL) tablet 650 mg (650 mg Oral Given 01/13/22 2133)    ED Course/ Medical Decision Making/ A&P                           Medical Decision Making Risk OTC drugs. Prescription drug management.   This patient presents to the ED for concern of headache, this involves an extensive number of treatment options, and is a complaint that carries with it a high risk of complications and morbidity.  The differential diagnosis includes CVA, cerebral venous thrombosis, pseudotumor cerebri, malignancy, tension, cluster, migraine type headache, meningitis, carotid/vertebral dissection, giant cell arteritis  Co morbidities that complicate the patient evaluation  See HPI   Additional history obtained:  Additional history obtained from EMR External records from outside source obtained and reviewed including hospital records   Lab Tests:  N/a   Imaging Studies ordered:  N/a   Cardiac Monitoring: / EKG:  The patient was maintained on a cardiac monitor.  I personally viewed and interpreted the cardiac monitored which showed an underlying rhythm of: Sinus rhythm   Consultations Obtained:  N/a   Problem List / ED Course / Critical interventions / Medication management  Headache I ordered medication including Imitrex,  ibuprofen, Tylenol   Reevaluation of the patient after these medicines showed that the patient improved I have reviewed the patients home medicines and have made adjustments as needed   Social Determinants of Health:  Homelessness.  Denies illicit drug use.   Test / Admission - Considered:  Headache Vitals signs within normal range and stable throughout visit. Question severity of headache given multiple ED visits in the past 24 to 36 hours for the same complaint.  Patient requesting opioid medication as the only cure for headache and refusing IV medication and states that everything else does not work.  Regardless, patient neurologically intact with overall benign physical exam findings.  Patient reported some relief of symptoms with medicines administered on emergency  department.  She repeatedly requested opioid medication of which was not administered.  Recommend continued outpatient therapy with Imitrex as needed, Tylenol/Motrin for headache.  Recommend follow-up with primary care and neurology with information provided for follow-up regarding headache.  True plan discussed with patient she can understand was agreeable to said plan. Worrisome signs and symptoms were discussed with the patient, and the patient acknowledged understanding to return to the ED if noticed. Patient was stable upon discharge.          Final Clinical Impression(s) / ED Diagnoses Final diagnoses:  Nonintractable headache, unspecified chronicity pattern, unspecified headache type    Rx / DC Orders ED Discharge Orders          Ordered    SUMAtriptan (IMITREX) 50 MG tablet  Every 2 hours PRN,   Status:  Discontinued        01/13/22 1946    SUMAtriptan (IMITREX) 50 MG tablet  Every 2 hours PRN        01/13/22 1946              Peter Garter, [place] 01/14/22 0000    Wynetta Fines, MD 01/14/22 1454

## 2022-01-13 NOTE — ED Triage Notes (Signed)
Patient arrived with complaints of a headache over the last 5 hours. Declines taking anything for pain prior to arrival.

## 2022-01-13 NOTE — Discharge Instructions (Signed)
Follow up with this headache specialist

## 2022-01-13 NOTE — ED Triage Notes (Signed)
Pt c/o headache x few days states Tylenol and Ibuprofen are not helping. "The only thing that helps is hydrocodone"  Pt was seen at here early this morning and at [facility] this afternoon for same symptoms.

## 2022-01-13 NOTE — ED Provider Notes (Signed)
[facility]-EMERGENCY DEPT Provider Note   CSN: [id number] Arrival date & time: 01/12/22  2327     History  Chief Complaint  Patient presents with   Headache    Amanda Burnett is a 36 y.o. female with history of homelessness who presents with concern for headache times a few hours.  States she did not take any medication prior to arrival.  States she was very sensitive to the light.  No blurred or double vision, no numbness and tingling or trauma to the head.  Denies fevers, chills, or IV drug use.  Patient also endorses that she is hungry and was cold from being outside this time.  Multiple layers of clothing on at this time.  It appears that this is patient's primary reason for presenting to the emergency department. I personally reviewed her medical records previous history of paranoid schizophrenia, generalized anxiety disorder.  Denies medications daily.  Denies history of migraines.  Patient has 24 ED visits this month.  HPI     Home Medications Prior to Admission medications   Medication Sig Start Date End Date Taking? Authorizing Provider  ARIPiprazole ER (ABILIFY MAINTENA) 400 MG SRER injection Inject 2 mLs (400 mg total) into the muscle every 28 (twenty-eight) days. 06/19/18   Malvin Johns, MD  benztropine (COGENTIN) 0.5 MG tablet Take 1 tablet (0.5 mg total) by mouth 2 (two) times daily. 06/19/18   Malvin Johns, MD  dicyclomine (BENTYL) 20 MG tablet Take 1 tablet (20 mg total) by mouth 2 (two) times daily. 12/31/21   Linwood Dibbles, MD  etodolac (LODINE) 300 MG capsule Take 1 capsule (300 mg total) by mouth every 8 (eight) hours as needed for pain 12/31/21   Linwood Dibbles, MD  risperiDONE (RISPERDAL) 3 MG tablet Take 1 tablet (3 mg total) by mouth 2 (two) times daily. 06/19/18   Malvin Johns, MD      Allergies    Patient has no known allergies.    Review of Systems   Review of Systems  Constitutional: Negative.   HENT: Negative.    Eyes:  Positive for  photophobia. Negative for visual disturbance.  Respiratory: Negative.    Cardiovascular: Negative.   Gastrointestinal: Negative.   Genitourinary: Negative.   Musculoskeletal: Negative.   Neurological:  Positive for headaches. Negative for dizziness, facial asymmetry and light-headedness.  Hematological: Negative.     Physical Exam Updated Vital Signs BP 119/79 (BP Location: Left Arm)   Pulse 81   Temp 97.9 F (36.6 C) (Oral)   Resp 18   Ht 5\' 6"  (1.676 m)   Wt 73 kg   LMP 12/30/2021 (Exact Date) Comment: states she is on her period currently and not currently sexually active  SpO2 100%   BMI 25.99 kg/m  Physical Exam Vitals and nursing note reviewed.  Constitutional:      Appearance: She is not ill-appearing or toxic-appearing.  HENT:     Head: Normocephalic and atraumatic.     Nose: Nose normal.     Mouth/Throat:     Mouth: Mucous membranes are moist.     Pharynx: Oropharynx is clear. Uvula midline. No oropharyngeal exudate or posterior oropharyngeal erythema.     Tonsils: No tonsillar exudate.  Eyes:     General: Lids are normal. Vision grossly intact.        Right eye: No discharge.        Left eye: No discharge.     Extraocular Movements: Extraocular movements intact.  Conjunctiva/sclera: Conjunctivae normal.     Pupils: Pupils are equal, round, and reactive to light.  Neck:     Trachea: Trachea and phonation normal.  Cardiovascular:     Rate and Rhythm: Normal rate and regular rhythm.     Pulses: Normal pulses.     Heart sounds: Normal heart sounds. No murmur heard. Pulmonary:     Effort: Pulmonary effort is normal. No tachypnea, bradypnea, accessory muscle usage, prolonged expiration or respiratory distress.     Breath sounds: Normal breath sounds. No wheezing or rales.  Chest:     Chest wall: No mass, lacerations, deformity, swelling, tenderness, crepitus or edema.  Abdominal:     General: Bowel sounds are normal. There is no distension.     Palpations:  Abdomen is soft.     Tenderness: There is no abdominal tenderness. There is no right CVA tenderness, left CVA tenderness, guarding or rebound.  Musculoskeletal:        General: No swelling or deformity.     Cervical back: Normal range of motion and neck supple.     Right lower leg: No edema.     Left lower leg: No edema.  Lymphadenopathy:     Cervical: No cervical adenopathy.  Skin:    General: Skin is warm and dry.     Capillary Refill: Capillary refill takes less than 2 seconds.  Neurological:     Mental Status: She is alert. Mental status is at baseline.     GCS: GCS eye subscore is 4. GCS verbal subscore is 5. GCS motor subscore is 6.     Cranial Nerves: Cranial nerves 2-12 are intact.     Gait: Gait is intact.  Psychiatric:        Mood and Affect: Mood normal.     ED Results / Procedures / Treatments   Labs (all labs ordered are listed, but only abnormal results are displayed) Labs Reviewed - No data to display  EKG None  Radiology No results found.  Procedures Procedures    Medications Ordered in ED Medications  metoCLOPramide (REGLAN) tablet 10 mg (has no administration in time range)  acetaminophen (TYLENOL) tablet 650 mg (has no administration in time range)  ibuprofen (ADVIL) tablet 400 mg (400 mg Oral Given 01/13/22 0016)    ED Course/ Medical Decision Making/ A&P                           Medical Decision Making 36 year old female with headache x 5 hours prior to arrival.  Vital signs normal on intake.  Cardiopulmonary abdominal exams are benign.  Patient neurologically intact, PERRL, EOMI.  No meningeal signs.  Headache improved after administration of ibuprofen in triage.  Will offer Tylenol and Reglan as well.  Emergent considerations for headache include subarachnoid hemorrhage, meningitis, temporal arteritis, glaucoma, cerebral ischemia, carotid/vertebral dissection, intracranial tumor, Venous sinus thrombosis, carbon monoxide poisoning, acute or  chronic subdural hemorrhage.  Other considerations include: Migraine, Cluster headache, Tension headache, Hypertension, Caffeine / alcohol / drug withdrawal, Pseudotumor cerebri, Arteriovenous malformation, Head injury, Neurocysticercosis, Post-lumbar puncture, Preeclampsia, Cervical arthritis, Refractive error causing strain, Dental abscess, Sinusitis, Otitis media, Temporomandibular joint syndrome, Depression, Somatoform disorder (eg, somatization) Trigeminal neuralgia, Glossopharyngeal neuralgia.  Concern for CNS etiology of her symptoms such as hemorrhage, meningitis, or vascular etiology is exceedingly low given patient's short duration of symptoms and improvement with ibuprofen.  Additionally has normal neurologic status.  Risk OTC drugs.   Clinical concern most  consistent with acute non-intractable headache.  Suspect component of malingering for secondary gain given patient's homelessness and very cold temperatures outside.  Patient with greater than 20 ED visits this month.  No evidence of acute neurologic dysfunction on physical exam, patient well-appearing, tolerating p.o. with improvement in her headache with ibuprofen.  Clinical concern for emergent etiology would warrant further ED workup or inpatient management is exceedingly low.  No further workup warranted in the ER at this time.  Admission considered but not felt warranted.  Amanda Burnett  voiced understanding of her medical evaluation and treatment plan. Each of their questions answered to their expressed satisfaction.  Return precautions were given.  Patient is well-appearing, stable, and was discharged in good condition.  This chart was dictated using voice recognition software, Dragon. Despite the best efforts of this provider to proofread and correct errors, errors may still occur which can change documentation meaning.  Final Clinical Impression(s) / ED Diagnoses Final diagnoses:  Acute nonintractable headache, unspecified headache  type    Rx / DC Orders ED Discharge Orders     None         Paris Lore, PA-C 01/13/22 7169    Gilda Crease, MD 01/13/22 [phone]

## 2022-01-13 NOTE — Discharge Instructions (Addendum)
You are seen in the ER today for your headaches.  Your physical exam and vital signs are very reassuring.  Please increase hydration.  Use Tylenol ibuprofen as needed for your headache.  Return to the ER if you develop any new severe symptoms.

## 2022-01-13 NOTE — ED Provider Notes (Signed)
[facility] EMERGENCY DEPARTMENT Provider Note   CSN: [id number] Arrival date & time: 01/13/22  1533     History  Chief Complaint  Patient presents with   Headache    Amanda Burnett is a 36 y.o. female.   Headache  [name] with past medical history significant for homelessness, chronic headaches was seen earlier today at [facility] emergency department  Patient is seen frequently in the emergency department.  I reviewed care everywhere and reviewed prior head CT which was obtained 04/22/2021 This was a normal head CT  Patient states that she has continued headaches.  She is requesting narcotic pain medication.  She states she has no new symptoms no slurred speech confusion no head injury she is not on any anticoagulation.    Home Medications Prior to Admission medications   Medication Sig Start Date End Date Taking? Authorizing Provider  ARIPiprazole ER (ABILIFY MAINTENA) 400 MG SRER injection Inject 2 mLs (400 mg total) into the muscle every 28 (twenty-eight) days. 06/19/18   Malvin Johns, MD  benztropine (COGENTIN) 0.5 MG tablet Take 1 tablet (0.5 mg total) by mouth 2 (two) times daily. 06/19/18   Malvin Johns, MD  dicyclomine (BENTYL) 20 MG tablet Take 1 tablet (20 mg total) by mouth 2 (two) times daily. 12/31/21   Linwood Dibbles, MD  etodolac (LODINE) 300 MG capsule Take 1 capsule (300 mg total) by mouth every 8 (eight) hours as needed for pain 12/31/21   Linwood Dibbles, MD  risperiDONE (RISPERDAL) 3 MG tablet Take 1 tablet (3 mg total) by mouth 2 (two) times daily. 06/19/18   Malvin Johns, MD      Allergies    Patient has no known allergies.    Review of Systems   Review of Systems  Neurological:  Positive for headaches.    Physical Exam Updated Vital Signs BP 112/77   Pulse 88   Temp 99.3 F (37.4 C) (Oral)   Resp 18   Ht 5\' 6"  (1.676 m)   Wt 73 kg   LMP 12/30/2021 (Exact Date) Comment: states she is on her period currently and  not currently sexually active  SpO2 100%   BMI 25.99 kg/m  Physical Exam Vitals and nursing note reviewed.  Constitutional:      General: She is not in acute distress. HENT:     Head: Normocephalic and atraumatic.     Nose: Nose normal.  Eyes:     General: No scleral icterus. Cardiovascular:     Rate and Rhythm: Normal rate and regular rhythm.     Pulses: Normal pulses.     Heart sounds: Normal heart sounds.  Pulmonary:     Effort: Pulmonary effort is normal. No respiratory distress.     Breath sounds: No wheezing.  Abdominal:     Palpations: Abdomen is soft.     Tenderness: There is no abdominal tenderness.  Musculoskeletal:     Cervical back: Normal range of motion.     Right lower leg: No edema.     Left lower leg: No edema.  Skin:    General: Skin is warm and dry.     Capillary Refill: Capillary refill takes less than 2 seconds.  Neurological:     Mental Status: She is alert. Mental status is at baseline.     Comments: Alert and oriented to self, place, time and event.   Speech is fluent, clear without dysarthria or dysphasia.   Strength 5/5 in upper/lower  extremities   Sensation intact in upper/lower extremities   Normal gait.  Smile symmetric  Psychiatric:        Mood and Affect: Mood normal.        Behavior: Behavior normal.     ED Results / Procedures / Treatments   Labs (all labs ordered are listed, but only abnormal results are displayed) Labs Reviewed - No data to display  EKG None  Radiology No results found.  Procedures Procedures    Medications Ordered in ED Medications - No data to display  ED Course/ Medical Decision Making/ A&P                           Medical Decision Making  Patient is a 36 year old female with past medical history significant for homelessness, chronic headaches was seen earlier today at [facility] emergency department  Patient is seen frequently in the emergency department.  I reviewed care everywhere and  reviewed prior head CT which was obtained 04/22/2021 This was a normal head CT  Patient states that she has continued headaches.  She is requesting narcotic pain medication.  She states she has no new symptoms no slurred speech confusion no head injury she is not on any anticoagulation.   Vitals NML   Reviewed EMR.  Patient is vital signs are normal here.  Reassuring exam.  She has numerous requests and symptoms however she will is understanding of the need for her to follow-up outpatient.  Will provide patient with information for headache specialist.  Discharged home with return precautions   Final Clinical Impression(s) / ED Diagnoses Final diagnoses:  Chronic nonintractable headache, unspecified headache type    Rx / DC Orders ED Discharge Orders     None         Gailen Shelter, [place] 01/13/22 2024    Virgina Norfolk, DO 01/13/22 2223

## 2022-01-13 NOTE — Discharge Instructions (Signed)
The visit emergency department today was overall reassuring.  As discussed, take Imitrex as needed for headache.  You can take 1 dose and then subsequent dose 2 hours later.  Please not take more than 2 tablets within 24-hour period.  Recommend follow-up with primary care as well as neurology given your recurrent headaches.  Please not hesitate to return to emergency department for worrisome signs and symptoms we discussed become apparent.

## 2022-01-14 ENCOUNTER — Encounter (HOSPITAL_COMMUNITY): Payer: Self-pay

## 2022-01-14 ENCOUNTER — Other Ambulatory Visit (HOSPITAL_COMMUNITY): Payer: Self-pay

## 2022-01-14 ENCOUNTER — Other Ambulatory Visit: Payer: Self-pay

## 2022-01-14 ENCOUNTER — Emergency Department (HOSPITAL_COMMUNITY)
Admission: EM | Admit: 2022-01-14 | Discharge: 2022-01-15 | Disposition: A | Discharge status: Home / Self Care | Payer: Medicaid Other | Attending: Emergency Medicine | Admitting: Emergency Medicine

## 2022-01-14 DIAGNOSIS — R519 Headache, unspecified: Secondary | ICD-10-CM | POA: Insufficient documentation

## 2022-01-14 NOTE — ED Triage Notes (Signed)
Pt states that she has had a headache for a few days and only Hydrocodone will help her. The other stuff that she was given last night did not help and pt states that we are trying to "kill her". Only Hydrocodone will help her at this point, per pt.

## 2022-01-14 NOTE — ED Notes (Signed)
Quick dc   Saddie Benders, PA-C 01/14/22 2227

## 2022-01-15 ENCOUNTER — Other Ambulatory Visit: Payer: Self-pay

## 2022-01-15 ENCOUNTER — Emergency Department (HOSPITAL_COMMUNITY)
Admission: EM | Admit: 2022-01-15 | Discharge: 2022-01-16 | Disposition: A | Discharge status: Home / Self Care | Payer: Medicaid Other | Source: Home / Self Care | Attending: Emergency Medicine | Admitting: Emergency Medicine

## 2022-01-15 DIAGNOSIS — R519 Headache, unspecified: Secondary | ICD-10-CM

## 2022-01-15 MED ORDER — DIPHENHYDRAMINE HCL 25 MG PO CAPS
25.0000 mg | ORAL_CAPSULE | Freq: Once | ORAL | Status: AC
  Administered 2022-01-15: 25 mg via ORAL
  Filled 2022-01-15: qty 1

## 2022-01-15 MED ORDER — KETOROLAC TROMETHAMINE 15 MG/ML IJ SOLN
15.0000 mg | Freq: Once | INTRAMUSCULAR | Status: DC
  Filled 2022-01-15: qty 1

## 2022-01-15 MED ORDER — IBUPROFEN 800 MG PO TABS
800.0000 mg | ORAL_TABLET | Freq: Once | ORAL | Status: AC
  Administered 2022-01-15: 800 mg via ORAL
  Filled 2022-01-15: qty 1

## 2022-01-15 MED ORDER — PROCHLORPERAZINE MALEATE 10 MG PO TABS
10.0000 mg | ORAL_TABLET | Freq: Once | ORAL | Status: AC
  Administered 2022-01-15: 10 mg via ORAL
  Filled 2022-01-15: qty 1

## 2022-01-15 MED ORDER — METOCLOPRAMIDE HCL 5 MG/ML IJ SOLN
10.0000 mg | Freq: Once | INTRAMUSCULAR | Status: DC
  Filled 2022-01-15: qty 2

## 2022-01-15 NOTE — Discharge Instructions (Signed)
Exam is reassuring, recommend taking over-the-counter pain medications, please remember to stay hydrated, please get plenty of sleep, decrease on stress this will help decrease frequency of headaches.  Please follow-up with community health and wellness for further evaluation.  Come back to the emergency department if you develop chest pain, shortness of breath, severe abdominal pain, uncontrolled nausea, vomiting, diarrhea.

## 2022-01-15 NOTE — ED Provider Triage Note (Signed)
Emergency Medicine Provider Triage Evaluation Note  Amanda Burnett , a 36 y.o. female  was evaluated in triage.  Pt complains of headache.  Has been seen several times recently for same.  Did have a head CT not too long ago that was negative.  She reports dull, aching headache.  She was seen last night as well, states "benadryl and hydrocodone helped last night".  Review of Systems  Positive: headache Negative: fever  Physical Exam  BP 122/84   Pulse 80   Temp 98.9 F (37.2 C)   Resp 18   Wt 73 kg   LMP 12/30/2021 (Exact Date) Comment: states she is on her period currently and not currently sexually active  SpO2 97%   BMI 25.98 kg/m  Gen:   Awake, no distress   Resp:  Normal effort  MSK:   Moves extremities without difficulty  Other:  AAOx3, moving extremities well, speech clear, no focal deficits  Medical Decision Making  Medically screening exam initiated at 10:10 PM.  Appropriate orders placed.  Amanda Burnett was informed that the remainder of the evaluation will be completed by another provider, this initial triage assessment does not replace that evaluation, and the importance of remaining in the ED until their evaluation is complete.  Headache.  Has been seen multiple times recently for same.  No focal deficits in triage.     Garlon Hatchet, PA-C 01/15/22 2211

## 2022-01-15 NOTE — ED Triage Notes (Signed)
Pt in with headache, ongoing x 2wks. Seen yesterday at [facility], and states Benadryl and hydrocodone helped, but pain persists today.

## 2022-01-15 NOTE — ED Provider Notes (Signed)
[facility]-EMERGENCY DEPT Provider Note   CSN: [id number] Arrival date & time: 01/14/22  1943     History  Chief Complaint  Patient presents with   Headache    Amanda Burnett is a 36 y.o. female.  HPI   Patient with medical history including anxiety, paranoia, homelessness presents with complaints of a headache, states that they been having this headache for the last 2 weeks, states headache is constant, states is all over their head, will occasionally have change in vision, photophobia, increased activity to light, there is no dizziness no nausea no vomiting, no paresthesias or weakness in the lower extremities, no recent head trauma, not on anticoag's, no associated neck pain no fevers no chills denies illicit drug use.  States that they have tried over-the-counter pain medication on relief, states that the only medication that works is narcotic medication.    Lab reviewed patient's chart has been seen in the past for similar presentation, has been seen a few times at [facility] for headaches, CT imaging was obtained which was negative, patient has been seen most recently 3 times last week for same presentation patient's had a benign workup and has been requesting narcotic medication at each visit, patient was given sumatriptan but states that this has not helped at all.  Home Medications Prior to Admission medications   Medication Sig Start Date End Date Taking? Authorizing Provider  ARIPiprazole ER (ABILIFY MAINTENA) 400 MG SRER injection Inject 2 mLs (400 mg total) into the muscle every 28 (twenty-eight) days. 06/19/18   Malvin Johns, MD  benztropine (COGENTIN) 0.5 MG tablet Take 1 tablet (0.5 mg total) by mouth 2 (two) times daily. 06/19/18   Malvin Johns, MD  dicyclomine (BENTYL) 20 MG tablet Take 1 tablet (20 mg total) by mouth 2 (two) times daily. 12/31/21   Linwood Dibbles, MD  etodolac (LODINE) 300 MG capsule Take 1 capsule (300 mg total) by mouth every 8 (eight)  hours as needed for pain 12/31/21   Linwood Dibbles, MD  risperiDONE (RISPERDAL) 3 MG tablet Take 1 tablet (3 mg total) by mouth 2 (two) times daily. 06/19/18   Malvin Johns, MD  SUMAtriptan (IMITREX) 50 MG tablet Take 1 tablet by mouth as needed for migraine. May repeat in 2 hours if headache persists or recurs. (Please do not take more than 2 tablets in 24 hours.). 01/13/22   Peter Garter, PA      Allergies    Patient has no known allergies.    Review of Systems   Review of Systems  Constitutional:  Negative for chills and fever.  Respiratory:  Negative for shortness of breath.   Cardiovascular:  Negative for chest pain.  Gastrointestinal:  Negative for abdominal pain.  Neurological:  Positive for headaches.    Physical Exam Updated Vital Signs BP 115/76 (BP Location: Left Arm)   Pulse 72   Temp 98.4 F (36.9 C) (Oral)   Resp 16   LMP 12/30/2021 (Exact Date) Comment: states she is on her period currently and not currently sexually active  SpO2 99%  Physical Exam Vitals and nursing note reviewed.  Constitutional:      General: She is not in acute distress.    Appearance: She is not ill-appearing.  HENT:     Head: Normocephalic and atraumatic.     Comments: There is no deformity of the head present no raccoon eyes or Battle sign noted.    Nose: No congestion.  Eyes:  Extraocular Movements: Extraocular movements intact.     Conjunctiva/sclera: Conjunctivae normal.     Pupils: Pupils are equal, round, and reactive to light.  Cardiovascular:     Rate and Rhythm: Normal rate and regular rhythm.     Pulses: Normal pulses.     Heart sounds: No murmur heard.    No friction rub. No gallop.  Pulmonary:     Effort: No respiratory distress.     Breath sounds: No wheezing, rhonchi or rales.  Musculoskeletal:     Comments: Spine was palpated was nontender to palpation no step-off deformities noted.  Skin:    General: Skin is warm and dry.  Neurological:     Mental Status: She  is alert.     GCS: GCS eye subscore is 4. GCS verbal subscore is 5. GCS motor subscore is 6.     Cranial Nerves: Cranial nerves 2-12 are intact.     Sensory: Sensation is intact.     Motor: No weakness.     Coordination: Romberg sign negative. Finger-Nose-Finger Test and Heel to [place] Test normal.     Comments: Cranial nerves II through XII grossly intact no difficulty with word finding following two-step commands there is no unilateral weakness present.  Psychiatric:        Mood and Affect: Mood normal.     ED Results / Procedures / Treatments   Labs (all labs ordered are listed, but only abnormal results are displayed) Labs Reviewed - No data to display  EKG None  Radiology No results found.  Procedures Procedures    Medications Ordered in ED Medications  diphenhydrAMINE (BENADRYL) capsule 25 mg (25 mg Oral Given 01/15/22 0245)  prochlorperazine (COMPAZINE) tablet 10 mg (10 mg Oral Given 01/15/22 0245)  ibuprofen (ADVIL) tablet 800 mg (800 mg Oral Given 01/15/22 0245)    ED Course/ Medical Decision Making/ A&P                           Medical Decision Making Risk Prescription drug management.   This patient presents to the ED for concern of headaches, this involves an extensive number of treatment options, and is a complaint that carries with it a high risk of complications and morbidity.  The differential diagnosis includes meningitis, CVA, dissection    Additional history obtained:  Additional history obtained from N/A External records from outside source obtained and reviewed including an ER visits   Co morbidities that complicate the patient evaluation  Headaches  Social Determinants of Health:  No primary care provider    Lab Tests:  I Ordered, and personally interpreted labs.  The pertinent results include: N/A   Imaging Studies ordered:  I ordered imaging studies including N/A I independently visualized and interpreted imaging which showed  n/a I agree with the radiologist interpretation   Cardiac Monitoring:  The patient was maintained on a cardiac monitor.  I personally viewed and interpreted the cardiac monitored which showed an underlying rhythm of: n/a   Medicines ordered and prescription drug management:  I ordered medication including migraine cocktail I have reviewed the patients home medicines and have made adjustments as needed  Critical Interventions:  N/A   Reevaluation:  Presents with a headache, requesting narcotic medication, she had no deficits present on exam, no evidence of meningitis, she had CT imaging performed 6 months ago which was unremarkable.  Will provide with a migraine cocktail and reassess  Reassessed having no complaints she is  agreement for discharge at this time.    Consultations Obtained:  N/a   Test Considered:  CT head-deferred as suspicion for intracranial bleed is low not anticoag no recent head trauma, I also doubt brain mass that she has CT imaging performed 6 months ago which was negative    Rule out   Low suspicion for CVA she has no focal deficit present my exam.  Low suspicion for dissection of the vertebral or carotid artery as presentation atypical of etiology.  Low suspicion for meningitis as she has no meningeal sign present.      Dispostion and problem list  After consideration of the diagnostic results and the patients response to treatment, I feel that the patent would benefit from discharge.  Headaches-suspect multifactorial, rebound headaches from narcotic use as well as tension-like headaches, will have her continue with over-the-counter pain medications, referred to community health and wellness for further evaluation.            Final Clinical Impression(s) / ED Diagnoses Final diagnoses:  Bad headache    Rx / DC Orders ED Discharge Orders     None         Carroll Sage, PA-C 01/15/22 0617    Molpus, Jonny Ruiz,  MD 01/15/22 [phone]

## 2022-01-16 MED ORDER — IBUPROFEN 800 MG PO TABS
800.0000 mg | ORAL_TABLET | Freq: Once | ORAL | Status: AC
  Administered 2022-01-16: 800 mg via ORAL
  Filled 2022-01-16: qty 1

## 2022-01-16 MED ORDER — DIPHENHYDRAMINE HCL 25 MG PO CAPS
25.0000 mg | ORAL_CAPSULE | Freq: Once | ORAL | Status: AC
  Administered 2022-01-16: 25 mg via ORAL
  Filled 2022-01-16: qty 1

## 2022-01-16 MED ORDER — PROCHLORPERAZINE MALEATE 5 MG PO TABS
10.0000 mg | ORAL_TABLET | Freq: Once | ORAL | Status: AC
  Administered 2022-01-16: 10 mg via ORAL
  Filled 2022-01-16: qty 2

## 2022-01-16 NOTE — ED Notes (Signed)
Pt back in lobby at this time. Pts name back in WPT.

## 2022-01-16 NOTE — ED Notes (Signed)
Called pts name 3x times to be roomed with no response. Taking pt OTF.

## 2022-01-16 NOTE — ED Provider Notes (Signed)
[facility] EMERGENCY DEPARTMENT Provider Note   CSN: [id number] Arrival date & time: 01/15/22  2132     History  Chief Complaint  Patient presents with   Headache    Amanda Burnett is a 36 y.o. female.  Patient with medical history including anxiety, paranoia, homelessness presents with complaints of a headache.  Headache has been ongoing for 2 weeks.  States this is the first time she has had headache.  She denies associated nausea, vomiting, visual change, weakness, difficulty with speech.  She states when she comes to the emergency room the medications she receives here do help particularly Benadryl, and hydrocodone.  She understands why she is not prescribed hydrocodone.  She states she cannot afford Benadryl from the store.  She states she has taken Tylenol and Motrin with mild relief.  She has been given a referral to neurology but she has not followed up.  She is requesting to be discharged with hydrocodone to provide her some relief.  The history is provided by the patient. No language interpreter was used.       Home Medications Prior to Admission medications   Medication Sig Start Date End Date Taking? Authorizing Provider  ARIPiprazole ER (ABILIFY MAINTENA) 400 MG SRER injection Inject 2 mLs (400 mg total) into the muscle every 28 (twenty-eight) days. 06/19/18   Johnn Hai, MD  benztropine (COGENTIN) 0.5 MG tablet Take 1 tablet (0.5 mg total) by mouth 2 (two) times daily. 06/19/18   Johnn Hai, MD  dicyclomine (BENTYL) 20 MG tablet Take 1 tablet (20 mg total) by mouth 2 (two) times daily. 12/31/21   Dorie Rank, MD  etodolac (LODINE) 300 MG capsule Take 1 capsule (300 mg total) by mouth every 8 (eight) hours as needed for pain 12/31/21   Dorie Rank, MD  risperiDONE (RISPERDAL) 3 MG tablet Take 1 tablet (3 mg total) by mouth 2 (two) times daily. 06/19/18   Johnn Hai, MD  SUMAtriptan (IMITREX) 50 MG tablet Take 1 tablet by mouth as needed for migraine. May  repeat in 2 hours if headache persists or recurs. (Please do not take more than 2 tablets in 24 hours.). 01/13/22   Wilnette Kales, PA      Allergies    Patient has no known allergies.    Review of Systems   Review of Systems  Eyes:  Negative for photophobia and visual disturbance.  Gastrointestinal:  Negative for nausea and vomiting.  Neurological:  Positive for headaches. Negative for weakness, light-headedness and numbness.  All other systems reviewed and are negative.   Physical Exam Updated Vital Signs BP 129/76   Pulse 76   Temp 98.6 F (37 C)   Resp 18   Wt 73 kg   LMP 12/30/2021 (Exact Date) Comment: states she is on her period currently and not currently sexually active  SpO2 98%   BMI 25.98 kg/m  Physical Exam Vitals and nursing note reviewed.  Constitutional:      General: She is not in acute distress.    Appearance: Normal appearance. She is not ill-appearing.  HENT:     Head: Normocephalic and atraumatic.     Nose: Nose normal.  Eyes:     General: No scleral icterus.    Extraocular Movements: Extraocular movements intact.     Conjunctiva/sclera: Conjunctivae normal.  Cardiovascular:     Rate and Rhythm: Normal rate and regular rhythm.     Pulses: Normal pulses.  Pulmonary:     Effort: Pulmonary  effort is normal. No respiratory distress.     Breath sounds: Normal breath sounds. No wheezing or rales.  Musculoskeletal:        General: Normal range of motion.     Cervical back: Normal range of motion.  Skin:    General: Skin is warm and dry.  Neurological:     General: No focal deficit present.     Mental Status: She is alert and oriented to person, place, and time. Mental status is at baseline.     Comments: Cranial nerves III through XII intact.  Full range of motion of bilateral upper in lower extremity, and with 5/5 strength of extensor and flexor muscle groups.  Sensation intact and symmetrical bilaterally.  Pupils equal round and reactive to  light.    ED Results / Procedures / Treatments   Labs (all labs ordered are listed, but only abnormal results are displayed) Labs Reviewed - No data to display  EKG None  Radiology No results found.  Procedures Procedures    Medications Ordered in ED Medications  ibuprofen (ADVIL) tablet 800 mg (has no administration in time range)  prochlorperazine (COMPAZINE) tablet 10 mg (has no administration in time range)  diphenhydrAMINE (BENADRYL) capsule 25 mg (has no administration in time range)    ED Course/ Medical Decision Making/ A&P                           Medical Decision Making Risk Prescription drug management.   Medical Decision Making / ED Course   This patient presents to the ED for concern of headache, this involves an extensive number of treatment options, and is a complaint that carries with it a high risk of complications and morbidity.  The differential diagnosis includes hypertensive urgency, migraine, tension headache, cluster headache, posttraumatic headache, SAH  MDM: 36 year old female presents today for evaluation of above-mentioned complaints.  Overall she is well-appearing.  Neurological exam without focal deficits.  Previously evaluated with a CT scan 6 months ago without evidence of acute concerns.  Considered obtaining a CT scan today however given the gradual onset, normal neurological exam low suspicion for acute intracranial etiology.  She states that symptoms been going on for 2 weeks.  Has had several visits to the ED for the same complaint.  Multiple times throughout the visit she requested hydrocodone.  Patient was given Benadryl, Compazine, and Motrin 800 mg with improvement in the headache.  Discussed importance of follow-up with neurology.  She states she does not have a follow-up and request addressed to their practice.  She states she will go there to schedule her appointment on Monday.  Patient is stable for discharge.  Discharged in stable  condition.  Return precautions discussed.  Patient voices understanding and is in agreement with plan.   Lab Tests: -I ordered, reviewed, and interpreted labs.   The pertinent results include:   Labs Reviewed - No data to display    EKG  EKG Interpretation  Date/Time:    Ventricular Rate:    PR Interval:    QRS Duration:   QT Interval:    QTC Calculation:   R Axis:     Text Interpretation:         Medicines ordered and prescription drug management: Meds ordered this encounter  Medications   ibuprofen (ADVIL) tablet 800 mg   prochlorperazine (COMPAZINE) tablet 10 mg   diphenhydrAMINE (BENADRYL) capsule 25 mg    -I have reviewed the  patients home medicines and have made adjustments as needed  Social Determinants of Health:  Factors impacting patients care include: homelessness    Reevaluation: After the interventions noted above, I reevaluated the patient and found that they have :improved  Co morbidities that complicate the patient evaluation  Past Medical History:  Diagnosis Date   Anxiety    Depression       Dispostion: Patient is stable for discharge.  Discharged in stable condition.  Return precautions discussed.  Patient voices understanding and is in agreement with plan.  Final Clinical Impression(s) / ED Diagnoses Final diagnoses:  Bad headache    Rx / DC Orders ED Discharge Orders     None         Marita Kansas, PA-C 01/16/22 3606    Zadie Rhine, MD 01/16/22 [phone]

## 2022-01-16 NOTE — Discharge Instructions (Signed)
Your exam today was reassuring.  You received few medications in the emergency room with improvement in your headache.  I recommend you buy Benadryl over-the-counter since it helps your headache.  Continue taking Tylenol and Motrin.  I will attach neurology information for you above.  Please schedule a follow-up appointment with Amanda Burnett.  For any concerning symptoms return to the emergency room.

## 2022-01-17 ENCOUNTER — Encounter (HOSPITAL_COMMUNITY): Payer: Self-pay

## 2022-01-17 ENCOUNTER — Emergency Department (HOSPITAL_COMMUNITY)
Admission: EM | Admit: 2022-01-17 | Discharge: 2022-01-17 | Disposition: A | Discharge status: Home / Self Care | Payer: Medicaid Other | Attending: Student | Admitting: Student

## 2022-01-17 ENCOUNTER — Other Ambulatory Visit: Payer: Self-pay

## 2022-01-17 ENCOUNTER — Emergency Department (HOSPITAL_COMMUNITY)
Admission: EM | Admit: 2022-01-17 | Discharge: 2022-01-18 | Disposition: A | Discharge status: Home / Self Care | Payer: Medicaid Other | Source: Home / Self Care | Attending: Emergency Medicine | Admitting: Emergency Medicine

## 2022-01-17 DIAGNOSIS — Z79899 Other long term (current) drug therapy: Secondary | ICD-10-CM | POA: Diagnosis not present

## 2022-01-17 DIAGNOSIS — R519 Headache, unspecified: Secondary | ICD-10-CM | POA: Insufficient documentation

## 2022-01-17 MED ORDER — HYDROCODONE-ACETAMINOPHEN 5-325 MG PO TABS
1.0000 | ORAL_TABLET | Freq: Once | ORAL | Status: AC
  Administered 2022-01-17: 1 via ORAL
  Filled 2022-01-17: qty 1

## 2022-01-17 NOTE — ED Provider Notes (Signed)
[facility] EMERGENCY DEPARTMENT Provider Note  CSN: [id number] Arrival date & time: 01/17/22 0636  Chief Complaint(s) Headache  HPI Amanda Burnett is a 36 y.o. female with PMH paranoid schizophrenia, generalized anxiety disorder, depression who presents emergency department for evaluation of persistent headaches.  Patient has been seen for the same complaint 10 times since 1117 across multiple emergency departments most recently being 2 days ago.  She endorses a persistent bandlike headache and is adamant to the only medication that works for her is hydrocodone.  She is refusing any IV medication or intramuscular injections at all of these ED visits.  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  She states that heroin is the only opioid in existence.  Denies numbness, tingling, weakness or other systemic symptoms.   Past Medical History Past Medical History:  Diagnosis Date   Anxiety    Depression    Patient Active Problem List   Diagnosis Date Noted   Paranoid schizophrenia (HCC)    Adjustment disorder 06/13/2018   Generalized anxiety disorder 12/15/2016   Home Medication(s) Prior to Admission medications   Medication Sig Start Date End Date Taking? Authorizing Provider  ARIPiprazole ER (ABILIFY MAINTENA) 400 MG SRER injection Inject 2 mLs (400 mg total) into the muscle every 28 (twenty-eight) days. 06/19/18   Malvin Johns, MD  benztropine (COGENTIN) 0.5 MG tablet Take 1 tablet (0.5 mg total) by mouth 2 (two) times daily. 06/19/18   Malvin Johns, MD  dicyclomine (BENTYL) 20 MG tablet Take 1 tablet (20 mg total) by mouth 2 (two) times daily. 12/31/21   Linwood Dibbles, MD  etodolac (LODINE) 300 MG capsule Take 1 capsule (300 mg total) by mouth every 8 (eight) hours as needed for pain 12/31/21    Linwood Dibbles, MD  risperiDONE (RISPERDAL) 3 MG tablet Take 1 tablet (3 mg total) by mouth 2 (two) times daily. 06/19/18   Malvin Johns, MD  SUMAtriptan (IMITREX) 50 MG tablet Take 1 tablet by mouth as needed for migraine. May repeat in 2 hours if headache persists or recurs. (Please do not take more than 2 tablets in 24 hours.). 01/13/22   Peter Garter, PA                                                                                                                                    Past Surgical History History reviewed. No pertinent surgical history. Family History History reviewed. No pertinent family history.  Social History Social History   Tobacco Use   Smoking status: Never   Smokeless tobacco: Never   Tobacco comments:    socially  Vaping Use   Vaping Use: Never used  Substance Use Topics   Alcohol use: Yes    Comment: drinks  2 days a week, beer, wine & liquor, binge drinking   Drug use: Never   Allergies Patient has no known allergies.  Review of Systems Review of Systems  Neurological:  Positive for headaches.    Physical Exam Vital Signs  I have reviewed the triage vital signs BP 113/70 (BP Location: Right Arm)   Pulse 85   Temp 98.1 F (36.7 C) (Oral)   Resp 18   Ht 5\' 6"  (1.676 m)   Wt 72.6 kg   LMP 12/30/2021 (Exact Date) Comment: states she is on her period currently and not currently sexually active  SpO2 100%   BMI 25.82 kg/m   Physical Exam Vitals and nursing note reviewed.  Constitutional:      General: She is not in acute distress.    Appearance: She is well-developed.  HENT:     Head: Normocephalic and atraumatic.  Eyes:     Conjunctiva/sclera: Conjunctivae normal.  Cardiovascular:     Rate and Rhythm: Normal rate and regular rhythm.     Heart sounds: No murmur heard. Pulmonary:     Effort: Pulmonary effort is normal. No respiratory distress.     Breath sounds: Normal breath sounds.  Abdominal:     Palpations: Abdomen is soft.      Tenderness: There is no abdominal tenderness.  Musculoskeletal:        General: No swelling.     Cervical back: Neck supple.  Skin:    General: Skin is warm and dry.     Capillary Refill: Capillary refill takes less than 2 seconds.  Neurological:     Mental Status: She is alert.     Cranial Nerves: No cranial nerve deficit.     Sensory: No sensory deficit.     Motor: No weakness.  Psychiatric:        Mood and Affect: Mood normal.     ED Results and Treatments Labs (all labs ordered are listed, but only abnormal results are displayed) Labs Reviewed - No data to display                                                                                                                        Radiology No results found.  Pertinent labs & imaging results that were available during my care of the patient were reviewed by me and considered in my medical decision making (see MDM for details).  Medications Ordered in ED Medications  HYDROcodone-acetaminophen (NORCO/VICODIN) 5-325 MG per tablet 1 tablet (has no administration in time range)  Procedures Procedures  (including critical care time)  Medical Decision Making / ED Course   This patient presents to the ED for concern of headache, this involves an extensive number of treatment options, and is a complaint that carries with it a high risk of complications and morbidity.  The differential diagnosis includes tension headache, opioid dependence, opioid withdrawal, chronic migraine headache, cluster headaches  MDM: Patient seen emergency room for evaluation of headache.  Physical exam is unremarkable.  Patient does appear to display signs of fixed delusions but is not actively psychotic and these delusions are currently actively being treated with psychiatric medication of which the patient is taking.  She  is not acting verbally aggressive or responding to internal stimuli and does not require emergency psychiatric intervention at this time.  I had a long discussion with the patient about how opioids are likely making her headaches worse as the withdrawal from opioid can cause headaches and itself.  She again denies all additional interventions and ultimately we shared decision making to agree to 1 single dose of Norco and discharged with outpatient follow-up.  Patient with no neurologic deficits and has had multiple negative head CTs in the past with no change in symptoms leading up to today and thus CT head was deferred.  Patient then discharged with outpatient follow-up.  I did inform her that we will not be providing a long-term outpatient prescription for opioids.   Additional history obtained:  -External records from outside source obtained and reviewed including: Chart review including previous notes, labs, imaging, consultation notes    Medicines ordered and prescription drug management: Meds ordered this encounter  Medications   HYDROcodone-acetaminophen (NORCO/VICODIN) 5-325 MG per tablet 1 tablet    -I have reviewed the patients home medicines and have made adjustments as needed  Critical interventions none    Social Determinants of Health:  Factors impacting patients care include: Multiple ER presentations   Reevaluation: After the interventions noted above, I reevaluated the patient and found that they have :improved  Co morbidities that complicate the patient evaluation  Past Medical History:  Diagnosis Date   Anxiety    Depression       Dispostion: I considered admission for this patient, but she does not meet inpatient criteria for admission and is safe for discharge with outpatient follow-up     Final Clinical Impression(s) / ED Diagnoses Final diagnoses:  Nonintractable headache, unspecified chronicity pattern, unspecified headache type      @PCDICTATION @    , MD 01/17/22 1205

## 2022-01-17 NOTE — ED Triage Notes (Signed)
Pt returns for headache and to speak about medication. States that hydrocodone is the only thing that give her a break from a headache. Also states that she keeps getting oral medicine that is supposed to be IV. Also wants a benadryl.

## 2022-01-17 NOTE — ED Triage Notes (Signed)
Patient has been here every day for weeks for chronic headache.  Patient is demanding narcotic pain medication. Patient reports they have given to her before.  Explained we do not prescribe narcotic pain medication for chronic pain.  She reports benadryl is not enough.

## 2022-01-18 ENCOUNTER — Emergency Department (HOSPITAL_COMMUNITY)
Admission: EM | Admit: 2022-01-18 | Discharge: 2022-01-19 | Disposition: A | Discharge status: Home / Self Care | Payer: Medicaid Other | Attending: Emergency Medicine | Admitting: Emergency Medicine

## 2022-01-18 ENCOUNTER — Other Ambulatory Visit: Payer: Self-pay

## 2022-01-18 ENCOUNTER — Encounter (HOSPITAL_COMMUNITY): Payer: Self-pay

## 2022-01-18 DIAGNOSIS — Z1152 Encounter for screening for COVID-19: Secondary | ICD-10-CM | POA: Diagnosis not present

## 2022-01-18 DIAGNOSIS — R519 Headache, unspecified: Secondary | ICD-10-CM | POA: Diagnosis present

## 2022-01-18 DIAGNOSIS — M25561 Pain in right knee: Secondary | ICD-10-CM | POA: Insufficient documentation

## 2022-01-18 DIAGNOSIS — Z5900 Homelessness unspecified: Secondary | ICD-10-CM | POA: Insufficient documentation

## 2022-01-18 LAB — CBC WITH DIFFERENTIAL/PLATELET
Abs Immature Granulocytes: 0.01 10*3/uL (ref 0.00–0.07)
Basophils Absolute: 0.1 10*3/uL (ref 0.0–0.1)
Basophils Relative: 1 %
Eosinophils Absolute: 0.1 10*3/uL (ref 0.0–0.5)
Eosinophils Relative: 2 %
HCT: 40.8 % (ref 36.0–46.0)
Hemoglobin: 14.7 g/dL (ref 12.0–15.0)
Immature Granulocytes: 0 %
Lymphocytes Relative: 45 %
Lymphs Abs: 2.7 10*3/uL (ref 0.7–4.0)
MCH: 33.1 pg (ref 26.0–34.0)
MCHC: 36 g/dL (ref 30.0–36.0)
MCV: 91.9 fL (ref 80.0–100.0)
Monocytes Absolute: 0.4 10*3/uL (ref 0.1–1.0)
Monocytes Relative: 8 %
Neutro Abs: 2.6 10*3/uL (ref 1.7–7.7)
Neutrophils Relative %: 44 %
Platelets: 305 10*3/uL (ref 150–400)
RBC: 4.44 MIL/uL (ref 3.87–5.11)
RDW: 13 % (ref 11.5–15.5)
WBC: 5.9 10*3/uL (ref 4.0–10.5)
nRBC: 0 % (ref 0.0–0.2)

## 2022-01-18 LAB — BASIC METABOLIC PANEL
Anion gap: 7 (ref 5–15)
BUN: 12 mg/dL (ref 6–20)
CO2: 21 mmol/L — ABNORMAL LOW (ref 22–32)
Calcium: 9.1 mg/dL (ref 8.9–10.3)
Chloride: 110 mmol/L (ref 98–111)
Creatinine, Ser: 0.93 mg/dL (ref 0.44–1.00)
GFR, Estimated: >60 mL/min (ref >60)
Glucose, Bld: 94 mg/dL (ref 70–99)
Potassium: 4.1 mmol/L (ref 3.5–5.1)
Sodium: 138 mmol/L (ref 135–145)

## 2022-01-18 LAB — I-STAT BETA HCG BLOOD, ED (MC, WL, AP ONLY): I-stat hCG, quantitative: <5 m[IU]/mL (ref <5)

## 2022-01-18 NOTE — ED Provider Notes (Signed)
[facility]-EMERGENCY DEPT Provider Note   CSN: [id number] Arrival date & time: 01/17/22  2027     History  Chief Complaint  Patient presents with   Headache    Amanda Burnett is a 36 y.o. female.   Headache Patient presents with headache.  Multiple visits for same.  Has had 7 visits this week for the same.  Has been worked up previously.  States it is over her whole head.  States that she thinks it is due to stress.  States she has had a lot of stress in her life.  States that her belly has been bothering her because she has been getting medicines orally that she should be getting IV.  States that she thinks hydrocodone will work and she got some the other day which helped.  No numbness or weakness.  No confusion.    Past Medical History:  Diagnosis Date   Anxiety    Depression     Home Medications Prior to Admission medications   Medication Sig Start Date End Date Taking? Authorizing Provider  ARIPiprazole ER (ABILIFY MAINTENA) 400 MG SRER injection Inject 2 mLs (400 mg total) into the muscle every 28 (twenty-eight) days. 06/19/18   Malvin Johns, MD  benztropine (COGENTIN) 0.5 MG tablet Take 1 tablet (0.5 mg total) by mouth 2 (two) times daily. 06/19/18   Malvin Johns, MD  dicyclomine (BENTYL) 20 MG tablet Take 1 tablet (20 mg total) by mouth 2 (two) times daily. 12/31/21   Linwood Dibbles, MD  etodolac (LODINE) 300 MG capsule Take 1 capsule (300 mg total) by mouth every 8 (eight) hours as needed for pain 12/31/21   Linwood Dibbles, MD  risperiDONE (RISPERDAL) 3 MG tablet Take 1 tablet (3 mg total) by mouth 2 (two) times daily. 06/19/18   Malvin Johns, MD  SUMAtriptan (IMITREX) 50 MG tablet Take 1 tablet by mouth as needed for migraine. May repeat in 2 hours if headache persists or recurs. (Please do not take more than 2 tablets in 24 hours.). 01/13/22   Peter Garter, PA      Allergies    Patient has no known allergies.    Review of Systems   Review of Systems   Neurological:  Positive for headaches.    Physical Exam Updated Vital Signs BP 110/62   Pulse 77   Temp 98.4 F (36.9 C) (Oral)   Resp 18   LMP 12/30/2021 (Exact Date) Comment: states she is on her period currently and not currently sexually active  SpO2 100%  Physical Exam Vitals and nursing note reviewed.  Eyes:     Pupils: Pupils are equal, round, and reactive to light.  Cardiovascular:     Rate and Rhythm: Regular rhythm.  Neurological:     Mental Status: She is alert.  Psychiatric:        Mood and Affect: Mood normal.     ED Results / Procedures / Treatments   Labs (all labs ordered are listed, but only abnormal results are displayed) Labs Reviewed - No data to display  EKG None  Radiology No results found.  Procedures Procedures    Medications Ordered in ED Medications - No data to display  ED Course/ Medical Decision Making/ A&P                           Medical Decision Making  Patient with chronic headaches.  Chronic pain.  Multiple visits for for same.  I reviewed previous notes.  Patient states she thinks it is due to stress which I agree.  Doubt severe intracranial abnormality.  However patient states that she wants hydrocodone for it.  I think narcotic treatment for this headache will make the headache come back more frequently.  Do not think it is indicated.  Well-appearing.  Discharge home with outpatient follow-up.        Final Clinical Impression(s) / ED Diagnoses Final diagnoses:  Bad headache    Rx / DC Orders ED Discharge Orders     None         Benjiman Core, MD 01/18/22 [phone]

## 2022-01-18 NOTE — ED Triage Notes (Signed)
Pt reports right sided headache ongoing intermittently for the last "few days." Denies photosensitivity. She was seen at [facility] yesterday for same complaint. She also reports right knee pain.

## 2022-01-18 NOTE — Discharge Instructions (Signed)
I think you are right and that there is a component of stress to the headache.  You need to follow-up with neurology and the primary care doctor.

## 2022-01-18 NOTE — ED Provider Triage Note (Signed)
Emergency Medicine Provider Triage Evaluation Note  Amanda Burnett , a 36 y.o. female  was evaluated in triage.  Pt complains of right sided headache. States it has been going on for a few hours. Pounding. Denies having photophobia, vomiting, visual changes, numbness or tingling or weakness.  .  Review of Systems  Positive: See above Negative:   Physical Exam  BP 108/76 (BP Location: Right Arm)   Pulse 86   Temp 98.2 F (36.8 C)   Resp 18   Ht 5\' 6"  (1.676 m)   Wt 73 kg   LMP 12/30/2021 (Exact Date) Comment: states she is on her period currently and not currently sexually active  SpO2 100%   BMI 25.99 kg/m  Gen:   Awake, no distress   Resp:  Normal effort  MSK:   Moves extremities without difficulty  Other:    Medical Decision Making  Medically screening exam initiated at 8:33 PM.  Appropriate orders placed.  Amanda Burnett was informed that the remainder of the evaluation will be completed by another provider, this initial triage assessment does not replace that evaluation, and the importance of remaining in the ED until their evaluation is complete.     Amanda Longest, PA-C 01/18/22 2035

## 2022-01-19 ENCOUNTER — Emergency Department (HOSPITAL_COMMUNITY)
Admission: EM | Admit: 2022-01-19 | Discharge: 2022-01-20 | Disposition: A | Discharge status: Home / Self Care | Payer: Medicaid Other | Source: Home / Self Care | Attending: Emergency Medicine | Admitting: Emergency Medicine

## 2022-01-19 ENCOUNTER — Other Ambulatory Visit: Payer: Self-pay

## 2022-01-19 ENCOUNTER — Encounter (HOSPITAL_COMMUNITY): Payer: Self-pay

## 2022-01-19 DIAGNOSIS — M25561 Pain in right knee: Secondary | ICD-10-CM

## 2022-01-19 DIAGNOSIS — Z20822 Contact with and (suspected) exposure to covid-19: Secondary | ICD-10-CM | POA: Insufficient documentation

## 2022-01-19 LAB — SARS CORONAVIRUS 2 BY RT PCR: SARS Coronavirus 2 by RT PCR: NEGATIVE

## 2022-01-19 MED ORDER — METOCLOPRAMIDE HCL 10 MG PO TABS
10.0000 mg | ORAL_TABLET | Freq: Once | ORAL | Status: AC
  Administered 2022-01-19: 10 mg via ORAL
  Filled 2022-01-19: qty 1

## 2022-01-19 MED ORDER — DEXAMETHASONE 4 MG PO TABS
10.0000 mg | ORAL_TABLET | Freq: Once | ORAL | Status: AC
  Administered 2022-01-19: 10 mg via ORAL
  Filled 2022-01-19: qty 3

## 2022-01-19 NOTE — ED Provider Triage Note (Signed)
Emergency Medicine Provider Triage Evaluation Note  Amanda Burnett , a 36 y.o. female  was evaluated in triage.  Pt complains of body aching all over especially her legs and knees.  No fall or trauma.  Multiple recent ED visits for headache.  Denies headache currently.  No fever, chills, nausea or vomiting.  No weakness or numbness.  No tingling.  No chest pain or shortness of breath.  No cough, runny nose or sore throat..  Review of Systems  Positive: Body aches, chills, headache Negative: Fever  Physical Exam  BP 121/80 (BP Location: Right Arm)   Pulse 77   Temp 98.1 F (36.7 C) (Oral)   Resp 17   LMP 12/30/2021 (Exact Date) Comment: states she is on her period currently and not currently sexually active  SpO2 100%  Gen:   Awake, no distress   Resp:  Normal effort  MSK:   Moves extremities without difficulty  Other:  Full range of motion bilateral knees without pain.  Compartments soft  Medical Decision Making  Medically screening exam initiated at 9:55 PM.  Appropriate orders placed.  Amanda Burnett was informed that the remainder of the evaluation will be completed by another provider, this initial triage assessment does not replace that evaluation, and the importance of remaining in the ED until their evaluation is complete.     Glynn Octave, MD 01/19/22 2156

## 2022-01-19 NOTE — ED Notes (Signed)
Patient verbalizes understanding of discharge instructions. Opportunity for questioning and answers were provided. Armband removed by staff, pt discharged from ED ambulatory.   

## 2022-01-19 NOTE — ED Provider Notes (Signed)
[facility] EMERGENCY DEPARTMENT Provider Note   CSN: [id number] Arrival date & time: 01/18/22  1839     History  Chief Complaint  Patient presents with   Headache    Amanda Burnett is a 36 y.o. female.  36 year old female with history of anxiety and depression, chronic headaches, homelessness presents to the ED for complaint of a right-sided frontal headache which began a few hours prior to arrival.  She denies taking any medications for her symptoms.  Symptoms have been constant, unchanged.  No modifying features.  Denies photophobia, vomiting, vision loss, extremity numbness or paresthesias, extremity weakness, fever, head injury.  Has been seen for same x8 in the past week.  The history is provided by the patient. No language interpreter was used.  Headache      Home Medications Prior to Admission medications   Medication Sig Start Date End Date Taking? Authorizing Provider  ARIPiprazole ER (ABILIFY MAINTENA) 400 MG SRER injection Inject 2 mLs (400 mg total) into the muscle every 28 (twenty-eight) days. 06/19/18   Malvin Johns, MD  benztropine (COGENTIN) 0.5 MG tablet Take 1 tablet (0.5 mg total) by mouth 2 (two) times daily. 06/19/18   Malvin Johns, MD  dicyclomine (BENTYL) 20 MG tablet Take 1 tablet (20 mg total) by mouth 2 (two) times daily. 12/31/21   Linwood Dibbles, MD  etodolac (LODINE) 300 MG capsule Take 1 capsule (300 mg total) by mouth every 8 (eight) hours as needed for pain 12/31/21   Linwood Dibbles, MD  risperiDONE (RISPERDAL) 3 MG tablet Take 1 tablet (3 mg total) by mouth 2 (two) times daily. 06/19/18   Malvin Johns, MD  SUMAtriptan (IMITREX) 50 MG tablet Take 1 tablet by mouth as needed for migraine. May repeat in 2 hours if headache persists or recurs. (Please do not take more than 2 tablets in 24 hours.). 01/13/22   Peter Garter, PA      Allergies    Patient has no known allergies.    Review of Systems   Review of Systems  Neurological:   Positive for headaches.  Ten systems reviewed and are negative for acute change, except as noted in the HPI.    Physical Exam Updated Vital Signs BP 112/80   Pulse 90   Temp 98.2 F (36.8 C)   Resp 18   Ht 5\' 6"  (1.676 m)   Wt 73 kg   LMP 12/30/2021 (Exact Date) Comment: states she is on her period currently and not currently sexually active  SpO2 100%   BMI 25.99 kg/m   Physical Exam Vitals and nursing note reviewed.  Constitutional:      General: She is not in acute distress.    Appearance: She is well-developed. She is not diaphoretic.     Comments: Nontoxic appearing and in NAD  HENT:     Head: Normocephalic and atraumatic.     Right Ear: External ear normal.     Left Ear: External ear normal.     Mouth/Throat:     Mouth: Mucous membranes are moist.  Eyes:     General: No scleral icterus.    Extraocular Movements: Extraocular movements intact.     Conjunctiva/sclera: Conjunctivae normal.     Pupils: Pupils are equal, round, and reactive to light.  Neck:     Comments: No meningismus  Pulmonary:     Effort: Pulmonary effort is normal. No respiratory distress.     Comments: Respirations even and unlabored Musculoskeletal:  General: Normal range of motion.     Cervical back: Normal range of motion.  Skin:    General: Skin is warm and dry.     Coloration: Skin is not pale.     Findings: No erythema or rash.  Neurological:     Mental Status: She is alert and oriented to person, place, and time.     Coordination: Coordination normal.     Gait: Gait normal.     Comments: GCS 15. Speech is goal oriented. No deficits appreciated to CN III-XII; symmetric eyebrow raise, no facial drooping, tongue midline. Patient has equal grip strength bilaterally with 5/5 strength against resistance in all major muscle groups bilaterally. Sensation to light touch intact. Patient moves extremities without ataxia. Patient ambulatory with steady gait.  Psychiatric:        Behavior:  Behavior normal.     ED Results / Procedures / Treatments   Labs (all labs ordered are listed, but only abnormal results are displayed) Labs Reviewed  BASIC METABOLIC PANEL - Abnormal; Notable for the following components:      Result Value   CO2 21 (*)    All other components within normal limits  CBC WITH DIFFERENTIAL/PLATELET  I-STAT BETA HCG BLOOD, ED ([facility], [facility], [facility] ONLY)  CBG MONITORING, ED    EKG None  Radiology No results found.  Procedures Procedures    Medications Ordered in ED Medications  dexamethasone (DECADRON) tablet 10 mg (10 mg Oral Given 01/19/22 0102)  metoCLOPramide (REGLAN) tablet 10 mg (10 mg Oral Given 01/19/22 0101)    ED Course/ Medical Decision Making/ A&P                           Medical Decision Making Risk Prescription drug management.   This patient presents to the ED for concern of frontal headache, this involves an extensive number of treatment options, and is a complaint that carries with it a high risk of complications and morbidity.  The differential diagnosis includes tension headache vs migraine vs ICH vs IIH vs malingering   Co morbidities that complicate the patient evaluation  Anxiety Depression    Additional history obtained:  External records from outside source obtained and reviewed including head CT from March 2023 which was negative for intracranial process   Lab Tests:  I Ordered, and personally interpreted labs.  The pertinent results include: CBC, BMP, and pregnancy test which were all normal/negative.   Cardiac Monitoring:  The patient was maintained on a cardiac monitor.  I personally viewed and interpreted the cardiac monitored which showed an underlying rhythm of: NSR   Medicines ordered and prescription drug management:  I ordered medication including Decadron and Reglan for headache  Reevaluation of the patient after these medicines showed that the patient  remained stable I have reviewed the  patients home medicines and have made adjustments as needed   Test Considered:  Head CT - however, prior imaging reassuring. Patient presenting chronically for this complaint with nonfocal neurologic exam today.   Problem List / ED Course:  Patient with no history of recent head injury or trauma.   No fever, nuchal rigidity, meningismus to suggest meningitis.  Neurologic exam today is nonfocal.  Doubt SAH/ICH No vision changes to raise concern for IIH; chronicity without changes in vision make this less likely. Also no associated HTN to suggest temporal arteritis   Reevaluation:  After the interventions noted above, I reevaluated the patient and found  that they have :stayed the same   Social Determinants of Health:  Homelessness    Dispostion:  After consideration of the diagnostic results and the patients response to treatment, I feel that the patent would benefit from outpatient neurology follow up. Referrals have been provided in the past. Return precautions discussed and provided. Patient discharged in stable condition with no unaddressed concerns.          Final Clinical Impression(s) / ED Diagnoses Final diagnoses:  Bad headache    Rx / DC Orders ED Discharge Orders     None         Antony Madura, PA-C 01/19/22 0103    Sabas Sous, MD 01/19/22 [phone]

## 2022-01-19 NOTE — ED Notes (Addendum)
Pt refused CBG - PA aware Pt provided with [place] sandwich bag and soda prior to discharge

## 2022-01-19 NOTE — ED Notes (Signed)
PA at bedside.

## 2022-01-19 NOTE — ED Triage Notes (Signed)
Pt reports her whole body is sore "my body is very tired and exhausted." Denies fevers/chills.

## 2022-01-20 ENCOUNTER — Other Ambulatory Visit (HOSPITAL_COMMUNITY): Payer: Self-pay

## 2022-01-20 MED ORDER — CYCLOBENZAPRINE HCL 10 MG PO TABS
10.0000 mg | ORAL_TABLET | Freq: Two times a day (BID) | ORAL | 0 refills | Status: DC | PRN
  Filled 2022-01-20: qty 20, 10d supply, fill #0

## 2022-01-20 MED ORDER — CYCLOBENZAPRINE HCL 10 MG PO TABS
5.0000 mg | ORAL_TABLET | Freq: Once | ORAL | Status: AC
  Administered 2022-01-20: 5 mg via ORAL
  Filled 2022-01-20: qty 1

## 2022-01-20 MED ORDER — IBUPROFEN 800 MG PO TABS
800.0000 mg | ORAL_TABLET | Freq: Three times a day (TID) | ORAL | 1 refills | Status: DC | PRN
  Filled 2022-01-20: qty 21, 7d supply, fill #0

## 2022-01-20 MED ORDER — IBUPROFEN 800 MG PO TABS: 800.0000 mg | ORAL_TABLET | Freq: Three times a day (TID) | ORAL | 0 refills | Status: DC

## 2022-01-20 MED ORDER — CYCLOBENZAPRINE HCL 10 MG PO TABS: 10.0000 mg | ORAL_TABLET | Freq: Two times a day (BID) | ORAL | 0 refills | Status: DC | PRN

## 2022-01-20 MED ORDER — CYCLOBENZAPRINE HCL 10 MG PO TABS
10.0000 mg | ORAL_TABLET | Freq: Two times a day (BID) | ORAL | 0 refills | Status: AC | PRN
  Filled 2022-01-20: qty 20, 10d supply, fill #0

## 2022-01-20 MED ORDER — IBUPROFEN 800 MG PO TABS
800.0000 mg | ORAL_TABLET | Freq: Three times a day (TID) | ORAL | 0 refills | Status: AC
  Filled 2022-01-20: qty 21, 7d supply, fill #0

## 2022-01-20 NOTE — ED Provider Notes (Signed)
[facility] EMERGENCY DEPARTMENT Provider Note   CSN: [id number] Arrival date & time: 01/19/22  2117     History  Chief Complaint  Patient presents with   Generalized Body Aches    Amanda Burnett is a 36 y.o. female.  Patient complains of muscle aches and right knee pain.  She was seen yesterday and had a CBC and a bmet that were okay.  She also has had an x-ray of the right knee done couple weeks ago.  Patient has a history of migraines  The history is provided by the patient and medical records. No language interpreter was used.  Knee Pain Location:  Knee Injury: no   Knee location:  R knee Pain details:    Quality:  Aching   Radiates to:  Does not radiate   Severity:  Moderate   Onset quality:  Sudden   Timing:  Constant Chronicity:  New Dislocation: no   Relieved by:  Nothing Associated symptoms: no back pain and no fatigue        Home Medications Prior to Admission medications   Medication Sig Start Date End Date Taking? Authorizing Provider  cyclobenzaprine (FLEXERIL) 10 MG tablet Take one every 12 hours if needed for muscle aches 01/20/22  Yes Milton Ferguson, MD  ibuprofen (ADVIL) 800 MG tablet Take 1 tablet (800 mg total) by mouth every 8 (eight) hours as needed for moderate pain. 01/20/22  Yes Milton Ferguson, MD  ARIPiprazole ER (ABILIFY MAINTENA) 400 MG SRER injection Inject 2 mLs (400 mg total) into the muscle every 28 (twenty-eight) days. 06/19/18   Johnn Hai, MD  benztropine (COGENTIN) 0.5 MG tablet Take 1 tablet (0.5 mg total) by mouth 2 (two) times daily. 06/19/18   Johnn Hai, MD  dicyclomine (BENTYL) 20 MG tablet Take 1 tablet (20 mg total) by mouth 2 (two) times daily. 12/31/21   Dorie Rank, MD  etodolac (LODINE) 300 MG capsule Take 1 capsule (300 mg total) by mouth every 8 (eight) hours as needed for pain 12/31/21   Dorie Rank, MD  risperiDONE (RISPERDAL) 3 MG tablet Take 1 tablet (3 mg total) by mouth 2 (two) times daily. 06/19/18    Johnn Hai, MD  SUMAtriptan (IMITREX) 50 MG tablet Take 1 tablet by mouth as needed for migraine. May repeat in 2 hours if headache persists or recurs. (Please do not take more than 2 tablets in 24 hours.). 01/13/22   Wilnette Kales, PA      Allergies    Patient has no known allergies.    Review of Systems   Review of Systems  Constitutional:  Negative for appetite change and fatigue.  HENT:  Negative for congestion, ear discharge and sinus pressure.   Eyes:  Negative for discharge.  Respiratory:  Negative for cough.   Cardiovascular:  Negative for chest pain.  Gastrointestinal:  Negative for abdominal pain and diarrhea.  Genitourinary:  Negative for frequency and hematuria.  Musculoskeletal:  Negative for back pain.       Right knee pain  Skin:  Negative for rash.  Neurological:  Negative for seizures and headaches.  Psychiatric/Behavioral:  Negative for hallucinations.     Physical Exam Updated Vital Signs BP 98/67 (BP Location: Left Arm)   Pulse 76   Temp 98.4 F (36.9 C) (Oral)   Resp 16   Ht _0  (1.676 m)   Wt 73 kg   LMP 12/30/2021 Comment: states she is on her period currently and not currently sexually  active  SpO2 100%   BMI 25.99 kg/m  Physical Exam Vitals and nursing note reviewed.  Constitutional:      Appearance: She is well-developed.  HENT:     Head: Normocephalic.     Nose: Nose normal.  Eyes:     General: No scleral icterus.    Conjunctiva/sclera: Conjunctivae normal.  Neck:     Thyroid: No thyromegaly.  Cardiovascular:     Rate and Rhythm: Normal rate and regular rhythm.     Heart sounds: No murmur heard.    No friction rub. No gallop.  Pulmonary:     Breath sounds: No stridor. No wheezing or rales.  Chest:     Chest wall: No tenderness.  Abdominal:     General: There is no distension.     Tenderness: There is no abdominal tenderness. There is no rebound.  Musculoskeletal:        General: Normal range of motion.     Cervical back:  Neck supple.     Comments: Tenderness right knee.  No obvious effusion  Lymphadenopathy:     Cervical: No cervical adenopathy.  Skin:    Findings: No erythema or rash.  Neurological:     Mental Status: She is alert and oriented to person, place, and time.     Motor: No abnormal muscle tone.     Coordination: Coordination normal.  Psychiatric:        Behavior: Behavior normal.     ED Results / Procedures / Treatments   Labs (all labs ordered are listed, but only abnormal results are displayed) Labs Reviewed  SARS CORONAVIRUS 2 BY RT PCR    EKG None  Radiology No results found.  Procedures Procedures    Medications Ordered in ED Medications  cyclobenzaprine (FLEXERIL) tablet 5 mg (has no administration in time range)    ED Course/ Medical Decision Making/ A&P                           Medical Decision Making Risk Prescription drug management.  This patient presents to the ED for concern of knee pain, this involves an extensive number of treatment options, and is a complaint that carries with it a high risk of complications and morbidity.  The differential diagnosis includes septic joint, musculoskeletal pain   Co morbidities that complicate the patient evaluation  Migraines   Additional history obtained:  Additional history obtained from patient External records from outside source obtained and reviewed including hospital records   Lab Tests:  I Ordered, and personally interpreted labs.  The pertinent results include: COVID-negative.  CBC and be met from previous visit unremarkable   Imaging Studies ordered:  No imaging this visit but previous visit x-ray of knee was unremarkable  Cardiac Monitoring: / EKG:  The patient was maintained on a cardiac monitor.  I personally viewed and interpreted the cardiac monitored which showed an underlying rhythm of: Normal sinus rhythm   Consultations Obtained:  No consultant  Problem List / ED Course /  Critical interventions / Medication management  Right knee pain I ordered medication including Flexeril for pain Reevaluation of the patient after these medicines showed that the patient improved I have reviewed the patients home medicines and have made adjustments as needed   Social Determinants of Health:  None   Test / Admission - Considered:  none  Patient with right knee pain.  She is given Motrin and Flexeril as needed and will  follow-up with PCP        Final Clinical Impression(s) / ED Diagnoses Final diagnoses:  Acute pain of right knee    Rx / DC Orders ED Discharge Orders          Ordered    ibuprofen (ADVIL) 800 MG tablet  Every 8 hours PRN        01/20/22 1215    cyclobenzaprine (FLEXERIL) 10 MG tablet        01/20/22 1215              Milton Ferguson, MD 01/24/22 [phone]

## 2022-01-20 NOTE — Discharge Instructions (Signed)
Follow-up with your doctor in the next 2 to 3 weeks for recheck.

## 2022-01-21 ENCOUNTER — Other Ambulatory Visit: Payer: Self-pay

## 2022-01-21 ENCOUNTER — Emergency Department (HOSPITAL_COMMUNITY)
Admission: EM | Admit: 2022-01-21 | Discharge: 2022-01-22 | Disposition: A | Discharge status: Home / Self Care | Payer: Medicaid Other | Attending: Emergency Medicine | Admitting: Emergency Medicine

## 2022-01-21 ENCOUNTER — Encounter (HOSPITAL_COMMUNITY): Payer: Self-pay

## 2022-01-21 DIAGNOSIS — F419 Anxiety disorder, unspecified: Secondary | ICD-10-CM

## 2022-01-21 DIAGNOSIS — R519 Headache, unspecified: Secondary | ICD-10-CM | POA: Insufficient documentation

## 2022-01-21 MED ORDER — IBUPROFEN 400 MG PO TABS
600.0000 mg | ORAL_TABLET | Freq: Once | ORAL | Status: AC
  Administered 2022-01-21: 600 mg via ORAL
  Filled 2022-01-21: qty 1

## 2022-01-21 MED ORDER — DIPHENHYDRAMINE HCL 25 MG PO CAPS
50.0000 mg | ORAL_CAPSULE | Freq: Once | ORAL | Status: AC
  Administered 2022-01-21: 50 mg via ORAL
  Filled 2022-01-21: qty 2

## 2022-01-21 MED ORDER — METOCLOPRAMIDE HCL 10 MG PO TABS
10.0000 mg | ORAL_TABLET | Freq: Once | ORAL | Status: AC
  Administered 2022-01-21: 10 mg via ORAL
  Filled 2022-01-21: qty 1

## 2022-01-21 MED ORDER — DEXAMETHASONE SODIUM PHOSPHATE 10 MG/ML IJ SOLN
10.0000 mg | Freq: Once | INTRAMUSCULAR | Status: DC
  Filled 2022-01-21: qty 1

## 2022-01-21 MED ORDER — ACETAMINOPHEN 500 MG PO TABS
1000.0000 mg | ORAL_TABLET | Freq: Once | ORAL | Status: AC
  Administered 2022-01-21: 1000 mg via ORAL
  Filled 2022-01-21: qty 2

## 2022-01-21 NOTE — ED Provider Notes (Signed)
[facility] EMERGENCY DEPARTMENT Provider Note   CSN: [id number] Arrival date & time: 01/21/22  1944     History  Chief Complaint  Patient presents with   Headache    Amanda Burnett is a 36 y.o. female.  Patient presents with recurrent headache over the past few weeks.  Patient's had similar headaches many times in the past.  Patient had CT scan of the head in March and recently has not followed up with neurology.  Patient denies any weakness, numbness, tingling, neck stiffness, fevers.       Home Medications Prior to Admission medications   Medication Sig Start Date End Date Taking? Authorizing Provider  ARIPiprazole ER (ABILIFY MAINTENA) 400 MG SRER injection Inject 2 mLs (400 mg total) into the muscle every 28 (twenty-eight) days. 06/19/18   Malvin Johns, MD  benztropine (COGENTIN) 0.5 MG tablet Take 1 tablet (0.5 mg total) by mouth 2 (two) times daily. 06/19/18   Malvin Johns, MD  cyclobenzaprine (FLEXERIL) 10 MG tablet Take 1 tablet (10 mg total) by mouth 2 (two) times daily as needed for muscle spasms. 01/20/22   Bethann Berkshire, MD  dicyclomine (BENTYL) 20 MG tablet Take 1 tablet (20 mg total) by mouth 2 (two) times daily. 12/31/21   Linwood Dibbles, MD  etodolac (LODINE) 300 MG capsule Take 1 capsule (300 mg total) by mouth every 8 (eight) hours as needed for pain 12/31/21   Linwood Dibbles, MD  ibuprofen (ADVIL) 800 MG tablet Take 1 tablet (800 mg total) by mouth 3 (three) times daily. 01/20/22   Bethann Berkshire, MD  risperiDONE (RISPERDAL) 3 MG tablet Take 1 tablet (3 mg total) by mouth 2 (two) times daily. 06/19/18   Malvin Johns, MD  SUMAtriptan (IMITREX) 50 MG tablet Take 1 tablet by mouth as needed for migraine. May repeat in 2 hours if headache persists or recurs. (Please do not take more than 2 tablets in 24 hours.). 01/13/22   Peter Garter, PA      Allergies    Patient has no known allergies.    Review of Systems   Review of Systems  Constitutional:   Negative for chills and fever.  HENT:  Negative for congestion.   Eyes:  Negative for visual disturbance.  Respiratory:  Negative for shortness of breath.   Cardiovascular:  Negative for chest pain.  Gastrointestinal:  Negative for abdominal pain and vomiting.  Genitourinary:  Negative for dysuria and flank pain.  Musculoskeletal:  Negative for back pain, neck pain and neck stiffness.  Skin:  Negative for rash.  Neurological:  Positive for headaches. Negative for light-headedness.    Physical Exam Updated Vital Signs BP (!) 140/107 (BP Location: Left Arm)   Pulse 75   Temp 98.9 F (37.2 C)   Resp 18   Ht 5\' 6"  (1.676 m)   Wt 73 kg   LMP 01/07/2022 (Approximate)   SpO2 98%   BMI 25.99 kg/m  Physical Exam Vitals and nursing note reviewed.  Constitutional:      General: She is not in acute distress.    Appearance: She is well-developed.  HENT:     Head: Normocephalic and atraumatic.     Mouth/Throat:     Mouth: Mucous membranes are moist.  Eyes:     General: No visual field deficit.       Right eye: No discharge.        Left eye: No discharge.     Conjunctiva/sclera: Conjunctivae normal.  Neck:  Trachea: No tracheal deviation.  Cardiovascular:     Rate and Rhythm: Normal rate.  Pulmonary:     Effort: Pulmonary effort is normal.     Breath sounds: Normal breath sounds.  Abdominal:     General: There is no distension.     Palpations: Abdomen is soft.     Tenderness: There is no abdominal tenderness. There is no guarding.  Musculoskeletal:     Cervical back: Normal range of motion and neck supple. No rigidity.  Skin:    General: Skin is warm.     Capillary Refill: Capillary refill takes less than 2 seconds.  Neurological:     General: No focal deficit present.     Mental Status: She is alert.     Cranial Nerves: No cranial nerve deficit, dysarthria or facial asymmetry.     Motor: No weakness.     Coordination: Coordination normal.  Psychiatric:        Mood  and Affect: Mood normal.     ED Results / Procedures / Treatments   Labs (all labs ordered are listed, but only abnormal results are displayed) Labs Reviewed - No data to display  EKG None  Radiology No results found.  Procedures Procedures    Medications Ordered in ED Medications  dexamethasone (DECADRON) injection 10 mg (has no administration in time range)  diphenhydrAMINE (BENADRYL) capsule 50 mg (has no administration in time range)  metoCLOPramide (REGLAN) tablet 10 mg (has no administration in time range)  acetaminophen (TYLENOL) tablet 1,000 mg (has no administration in time range)  ibuprofen (ADVIL) tablet 600 mg (600 mg Oral Given 01/21/22 2011)    ED Course/ Medical Decision Making/ A&P                           Medical Decision Making Risk OTC drugs. Prescription drug management.   Patient presents with recurrent headaches  For weeks overall similar to previous.  Patient has normal neurologic exam, overall well-appearing.  Medical records reviewed and multiple visits to the ER for different concerns and similar.  Reviewed CT scan March 2023 results no acute abnormalities.  No signs of significant infection or meningitis on exam.  Discussed importance of follow-up with neurology.  Patient said steroids have worked for her in the past.  Migraine cocktail ordered and plan for discharge and close outpatient follow-up.        Final Clinical Impression(s) / ED Diagnoses Final diagnoses:  Headache, unspecified headache type    Rx / DC Orders ED Discharge Orders     None         Blane Ohara, MD 01/21/22 2316

## 2022-01-21 NOTE — ED Provider Triage Note (Signed)
Emergency Medicine Provider Triage Evaluation Note  Amanda Burnett , a 36 y.o. female  was evaluated in triage.  Pt complains of headache all over head.  This headache started a couple of hours ago.  She has no associated nausea, vomiting, dizziness, vision change, speech change, numbness or weakness, gait abnormalities.  She has been having headaches frequently over the past 2 to 3 weeks.  She has been seen numerous times for this.  Her last brain imaging was done in March 2023 and was normal.  Review of Systems  Positive:  Negative:   Physical Exam  BP (!) 140/107 (BP Location: Left Arm)   Pulse 75   Temp 98.9 F (37.2 C)   Resp 18   Ht 5\' 6"  (1.676 m)   Wt 73 kg   LMP 01/07/2022 (Approximate)   SpO2 98%   BMI 25.99 kg/m  Gen:   Awake, no distress   Resp:  Normal effort  MSK:   Moves extremities without difficulty  Other:  No focal deficits  Medical Decision Making  Medically screening exam initiated at 8:06 PM.  Appropriate orders placed.  Amanda Burnett was informed that the remainder of the evaluation will be completed by another provider, this initial triage assessment does not replace that evaluation, and the importance of remaining in the ED until their evaluation is complete.     Amanda Burnett 01/21/22 2007

## 2022-01-21 NOTE — Discharge Instructions (Signed)
Use Tylenol and ibuprofen together every 6 hours as needed for pain. You can try Benadryl every 6 hours as needed for migraine-like headaches but remember make you sleepy so no driving. Follow-up with neurology for your frequent headaches. Stay well-hydrated with water.

## 2022-01-21 NOTE — ED Triage Notes (Signed)
Pt c/o headache for the past few days. Pt states she has had previously, went away yesterday and when she woke up today around lunchtime her headache was back. Pt denies vision problems, nausea or vomiting. Pt states she took ibuprofen 800mg  earlier today w/o relief.

## 2022-01-25 ENCOUNTER — Encounter (HOSPITAL_COMMUNITY): Payer: Self-pay

## 2022-01-25 ENCOUNTER — Emergency Department (HOSPITAL_COMMUNITY): Payer: Medicaid Other

## 2022-01-25 ENCOUNTER — Emergency Department (HOSPITAL_COMMUNITY)
Admission: EM | Admit: 2022-01-25 | Discharge: 2022-01-25 | Discharge status: Against Medical Advice | Payer: Medicaid Other | Attending: Student | Admitting: Student

## 2022-01-25 ENCOUNTER — Other Ambulatory Visit: Payer: Self-pay

## 2022-01-25 DIAGNOSIS — R519 Headache, unspecified: Secondary | ICD-10-CM | POA: Diagnosis not present

## 2022-01-25 DIAGNOSIS — Z5321 Procedure and treatment not carried out due to patient leaving prior to being seen by health care provider: Secondary | ICD-10-CM | POA: Diagnosis not present

## 2022-01-25 NOTE — ED Triage Notes (Signed)
Patient said she has a stopped up nose and headache. Tried tylenol and benadryl.

## 2022-01-25 NOTE — ED Notes (Signed)
Patient refused covid swab

## 2022-01-26 ENCOUNTER — Encounter (HOSPITAL_COMMUNITY): Payer: Self-pay

## 2022-01-26 ENCOUNTER — Emergency Department (HOSPITAL_COMMUNITY)
Admission: EM | Admit: 2022-01-26 | Discharge: 2022-01-27 | Disposition: A | Discharge status: Home / Self Care | Payer: Medicaid Other | Source: Home / Self Care | Attending: Student | Admitting: Student

## 2022-01-26 ENCOUNTER — Emergency Department (HOSPITAL_COMMUNITY)
Admission: EM | Admit: 2022-01-26 | Discharge: 2022-01-26 | Disposition: A | Discharge status: Home / Self Care | Payer: Medicaid Other | Attending: Emergency Medicine | Admitting: Emergency Medicine

## 2022-01-26 ENCOUNTER — Other Ambulatory Visit: Payer: Self-pay

## 2022-01-26 DIAGNOSIS — R519 Headache, unspecified: Secondary | ICD-10-CM | POA: Insufficient documentation

## 2022-01-26 DIAGNOSIS — G8929 Other chronic pain: Secondary | ICD-10-CM | POA: Insufficient documentation

## 2022-01-26 MED ORDER — DIPHENHYDRAMINE HCL 25 MG PO CAPS
50.0000 mg | ORAL_CAPSULE | Freq: Once | ORAL | Status: AC
  Administered 2022-01-26: 50 mg via ORAL
  Filled 2022-01-26: qty 2

## 2022-01-26 MED ORDER — DEXAMETHASONE SODIUM PHOSPHATE 10 MG/ML IJ SOLN
10.0000 mg | Freq: Once | INTRAMUSCULAR | Status: AC
  Administered 2022-01-26: 10 mg via INTRAMUSCULAR
  Filled 2022-01-26: qty 1

## 2022-01-26 NOTE — ED Triage Notes (Signed)
Pt states that it feels like someone is beating on her head with a rock.

## 2022-01-26 NOTE — Discharge Instructions (Addendum)
Thank you for letting us take care of you today.  It is very important you follow-up with neurology in the office as previously recommended. They will be able to best manage your chronic headaches.

## 2022-01-26 NOTE — ED Provider Notes (Signed)
[facility] Provider Note   CSN: [id number] Arrival date & time: 01/26/22  0010     History  Chief Complaint  Patient presents with   Headache    Amanda Burnett is a 36 y.o. female who presents to ED c/o generalized headache onset ~2.5 weeks ago for which she has had multiple ED visits. Pt states headache is unchanged since previous and she has a chronic history of similar headaches but has been unable to follow-up with neurology due to social and financial complications. She denies nausea, vomiting, cough, congestion, abdominal pain, diarrhea, chest pain, photophobia, phonophobia, change in vision, focal weakness, change in PO intake, head injury, change in headache characteristics, suicidal ideation, homicidal or violent ideation, or other concerns. She states she has received the "headache" cocktails in the ED and they do nothing for her except for the steroids and Benadryl. She states she is unable to control the headaches chronically due to high levels of stress and the only thing that will help her at home is Percocet though she has been denied prescriptions for this at multiple visits.      Home Medications Prior to Admission medications   Medication Sig Start Date End Date Taking? Authorizing Provider  ARIPiprazole ER (ABILIFY MAINTENA) 400 MG SRER injection Inject 2 mLs (400 mg total) into the muscle every 28 (twenty-eight) days. 06/19/18   Malvin Johns, MD  benztropine (COGENTIN) 0.5 MG tablet Take 1 tablet (0.5 mg total) by mouth 2 (two) times daily. 06/19/18   Malvin Johns, MD  cyclobenzaprine (FLEXERIL) 10 MG tablet Take 1 tablet (10 mg total) by mouth 2 (two) times daily as needed for muscle spasms. 01/20/22   Bethann Berkshire, MD  dicyclomine (BENTYL) 20 MG tablet Take 1 tablet (20 mg total) by mouth 2 (two) times daily. 12/31/21   Linwood Dibbles, MD  etodolac (LODINE) 300 MG capsule Take 1 capsule (300 mg total) by mouth every 8 (eight) hours as  needed for pain 12/31/21   Linwood Dibbles, MD  ibuprofen (ADVIL) 800 MG tablet Take 1 tablet (800 mg total) by mouth 3 (three) times daily. 01/20/22   Bethann Berkshire, MD  risperiDONE (RISPERDAL) 3 MG tablet Take 1 tablet (3 mg total) by mouth 2 (two) times daily. 06/19/18   Malvin Johns, MD  SUMAtriptan (IMITREX) 50 MG tablet Take 1 tablet by mouth as needed for migraine. May repeat in 2 hours if headache persists or recurs. (Please do not take more than 2 tablets in 24 hours.). 01/13/22   Peter Garter, PA      Allergies    Patient has no known allergies.    Review of Systems   Review of Systems  Constitutional:  Negative for activity change, appetite change, chills and fever.  HENT:  Negative for congestion, ear pain, rhinorrhea, sinus pressure and sore throat.   Eyes:  Negative for photophobia, pain and visual disturbance.  Respiratory:  Negative for apnea, cough, chest tightness and shortness of breath.   Cardiovascular:  Negative for chest pain and palpitations.  Gastrointestinal:  Negative for abdominal pain, constipation, diarrhea, nausea and vomiting.  Genitourinary:  Negative for difficulty urinating, dysuria, flank pain and hematuria.  Musculoskeletal:  Negative for arthralgias, back pain, gait problem, neck pain and neck stiffness.  Skin:  Negative for color change and rash.  Neurological:  Positive for headaches. Negative for dizziness, seizures, syncope, speech difficulty, weakness, light-headedness and numbness.  Psychiatric/Behavioral:  Negative for self-injury and suicidal ideas.   All  other systems reviewed and are negative.   Physical Exam Updated Vital Signs BP 118/81   Pulse 79   Temp 98 F (36.7 C) (Oral)   Resp 16   LMP 01/25/2022 (Exact Date)   SpO2 100%  Physical Exam Vitals and nursing note reviewed.  Constitutional:      General: She is not in acute distress.    Appearance: Normal appearance. She is not ill-appearing or toxic-appearing.  HENT:     Head:  Normocephalic and atraumatic.     Mouth/Throat:     Mouth: Mucous membranes are moist.  Eyes:     Extraocular Movements: Extraocular movements intact.     Conjunctiva/sclera: Conjunctivae normal.  Cardiovascular:     Rate and Rhythm: Normal rate and regular rhythm.  Pulmonary:     Effort: Pulmonary effort is normal.     Breath sounds: Normal breath sounds.  Abdominal:     General: Abdomen is flat.     Palpations: Abdomen is soft.  Musculoskeletal:        General: Normal range of motion.     Cervical back: Neck supple.     Right lower leg: No edema.     Left lower leg: No edema.  Skin:    General: Skin is warm and dry.     Capillary Refill: Capillary refill takes less than 2 seconds.  Neurological:     Mental Status: She is alert.     Cranial Nerves: Cranial nerves 2-12 are intact.     Motor: Motor function is intact.     Coordination: Coordination is intact.  Psychiatric:        Attention and Perception: She does not perceive auditory or visual hallucinations.        Speech: Speech is rapid and pressured. Speech is not slurred.        Behavior: Behavior is cooperative.        Thought Content: Thought content does not include homicidal or suicidal ideation. Thought content does not include homicidal or suicidal plan.     ED Results / Procedures / Treatments   Labs (all labs ordered are listed, but only abnormal results are displayed) Labs Reviewed - No data to display  EKG None  Radiology DG Knee Complete 4 Views Right  Result Date: 01/25/2022 CLINICAL DATA:  Chronic right knee soreness.  No known injury. EXAM: RIGHT KNEE - COMPLETE 4 VIEW COMPARISON:  Right knee radiographs dated 12/30/2021 FINDINGS: No evidence of fracture, dislocation, or joint effusion. No evidence of arthropathy or other focal bone abnormality. Soft tissues are unremarkable. IMPRESSION: Normal radiographs of the right knee Electronically Signed   By: Darrin Nipper M.D.   On: 01/25/2022 17:02     Procedures None  Medications Ordered in ED Medications  dexamethasone (DECADRON) injection 10 mg (10 mg Intramuscular Given 01/26/22 0719)  diphenhydrAMINE (BENADRYL) capsule 50 mg (50 mg Oral Given 01/26/22 [id number])    ED Course/ Medical Decision Making/ A&P                           Medical Decision Making Risk Prescription drug management.   36 year old female presenting with complaint of acute on chronic headache for over 2 weeks now with multiple ED visits. Pt has stable vital signs, is neurologically intact, no focal neuro deficits, no cranial nerve deficits, no signs of dehydration, is well appearing, no meningismus signs, and is not a harm to herself or others. Her headaches  are chronic and need outpatient neurological evaluation and likely long-term management in an outpatient setting, however, despite pt being referred to neurology and importance of this follow-up discussed at multiple previous ED visits, pt has not yet attended follow-up and continues to return to the ED regularly for treatment of her headaches. Her last CT scan of her brain was just 4 days ago and was unremarkable--can be reviewed below. Requesting Percocet stating it is the only medication that will relieve her headaches at home and that when she comes to the ED "a meal and a long nap help the most." Opioids not recommended for chronic headache and will not be fulfilling this request. Offered headache cocktail, pt stated only medications that help her are steroids and benadryl so IM dose of Decadron and PO Benadryl ordered for headache relief but otherwise will continue to strongly recommend outpatient neurologic follow-up with high suspicion for patient malingering.   EXAM: CT HEAD WITHOUT CONTRAST  TECHNIQUE: Contiguous axial images were obtained from the base of the skull through the vertex without intravenous contrast.  RADIATION DOSE REDUCTION: This exam was performed according to the departmental  dose-optimization program which includes automated exposure control, adjustment of the mA and/or kV according to patient size and/or use of iterative reconstruction technique.  COMPARISON:  None Available.  FINDINGS: Brain: The brain shows a normal appearance without evidence of malformation, atrophy, old or acute small or large vessel infarction, mass lesion, hemorrhage, hydrocephalus or extra-axial collection.  Vascular: No hyperdense vessel. No evidence of atherosclerotic calcification.  Skull: Normal.  No traumatic finding.  No focal bone lesion.  Sinuses/Orbits: Sinuses are clear. Orbits appear normal. Mastoids are clear.  Other: None significant  IMPRESSION: Normal head CT.   Electronically Signed   By: Nelson Chimes M.D.   On: 01/22/2022 20:01   Lance Bosch, MD - 01/22/2022 Formatting of this note might be different from the original. CLINICAL DATA:  Headache over the last 2 weeks.  EXAM: CT HEAD WITHOUT CONTRAST  TECHNIQUE: Contiguous axial images were obtained from the base of the skull through the vertex without intravenous contrast.  RADIATION DOSE REDUCTION: This exam was performed according to the departmental dose-optimization program which includes automated exposure control, adjustment of the mA and/or kV according to patient size and/or use of iterative reconstruction technique.  COMPARISON:  None Available.  FINDINGS: Brain: The brain shows a normal appearance without evidence of malformation, atrophy, old or acute small or large vessel infarction, mass lesion, hemorrhage, hydrocephalus or extra-axial collection.  Vascular: No hyperdense vessel. No evidence of atherosclerotic calcification.  Skull: Normal.  No traumatic finding.  No focal bone lesion.  Sinuses/Orbits: Sinuses are clear. Orbits appear normal. Mastoids are clear.  Other: None significant  IMPRESSION: Normal head CT.   Electronically Signed   By: Nelson Chimes M.D.   On: 01/22/2022 20:01         Final Clinical Impression(s) / ED Diagnoses Final diagnoses:  Chronic headache with normal neurologic examination    Rx / DC Orders ED Discharge Orders     None         Suzzette Righter, PA-C 01/26/22 [id number]    LongWonda Olds, MD 01/29/22 [phone]

## 2022-01-26 NOTE — ED Triage Notes (Signed)
Pt reports with a headache for several days. Pt was sleeping in the lobby when her name was called several hrs ago.

## 2022-01-27 ENCOUNTER — Other Ambulatory Visit: Payer: Self-pay

## 2022-01-27 ENCOUNTER — Encounter (HOSPITAL_COMMUNITY): Payer: Self-pay

## 2022-01-27 ENCOUNTER — Emergency Department (HOSPITAL_COMMUNITY)
Admission: EM | Admit: 2022-01-27 | Discharge: 2022-01-27 | Disposition: A | Discharge status: Home / Self Care | Payer: Medicaid Other | Attending: Emergency Medicine | Admitting: Emergency Medicine

## 2022-01-27 DIAGNOSIS — R519 Headache, unspecified: Secondary | ICD-10-CM | POA: Diagnosis not present

## 2022-01-27 MED ORDER — DIPHENHYDRAMINE HCL 25 MG PO CAPS
25.0000 mg | ORAL_CAPSULE | Freq: Once | ORAL | Status: AC
  Administered 2022-01-27: 25 mg via ORAL
  Filled 2022-01-27: qty 1

## 2022-01-27 MED ORDER — DEXAMETHASONE 10 MG/ML FOR PEDIATRIC ORAL USE
10.0000 mg | Freq: Once | INTRAMUSCULAR | Status: AC
  Administered 2022-01-27: 10 mg via ORAL
  Filled 2022-01-27: qty 1

## 2022-01-27 NOTE — Discharge Instructions (Signed)
Please follow-up with neurology as previously instructed Return for any new or worsening symptoms

## 2022-01-27 NOTE — ED Notes (Signed)
Pt upset about being discharged. Says she wanted to  hangout a while and see if medication from yesterday helped a little.

## 2022-01-27 NOTE — ED Provider Notes (Signed)
[facility] DEPT Provider Note  CSN: [id number] Arrival date & time: 01/26/22 2243  Chief Complaint(s) Headache  HPI Amanda Burnett is a 36 y.o. female with PMH paranoid schizophrenia, delusional disorder, anxiety, depression, chronic headaches who presents emergency department for evaluation of a headache.  This is the patient's 13th visit since 01/13/2022 for headache.  Previous presentations involve similar headache complaints and distrust of medical providers especially IV and intramuscular medications.  I personally saw this patient on 01/17/2022 with exact same complaints and similar delusional behavior including thoughts of being the daughter Gwynne Edinger and [employer].  She states that because of her family lineage she is unable to follow-up outpatient with neurology.  She is requesting a dose of steroids and Benadryl today as this has helped in the past.  Previous presentations patient has voiced that opioids are the only thing that helps her.  Denies numbness, tingling, weakness or other neurologic complaints.   Past Medical History Past Medical History:  Diagnosis Date   Anxiety    Depression    Patient Active Problem List   Diagnosis Date Noted   Anxiety 01/21/2022   Paranoid schizophrenia ([place])    Adjustment disorder 06/13/2018   Generalized anxiety disorder 12/15/2016   Home Medication(s) Prior to Admission medications   Medication Sig Start Date End Date Taking? Authorizing Provider  ARIPiprazole ER (ABILIFY MAINTENA) 400 MG SRER injection Inject 2 mLs (400 mg total) into the muscle every 28 (twenty-eight) days. 06/19/18   Johnn Hai, MD  benztropine (COGENTIN) 0.5 MG tablet Take 1 tablet (0.5 mg total) by mouth 2 (two) times daily. 06/19/18   Johnn Hai, MD  cyclobenzaprine (FLEXERIL) 10 MG tablet Take 1 tablet (10 mg total) by mouth 2 (two) times daily as needed for muscle spasms. 01/20/22   Milton Ferguson, MD  dicyclomine (BENTYL)  20 MG tablet Take 1 tablet (20 mg total) by mouth 2 (two) times daily. 12/31/21   Dorie Rank, MD  etodolac (LODINE) 300 MG capsule Take 1 capsule (300 mg total) by mouth every 8 (eight) hours as needed for pain 12/31/21   Dorie Rank, MD  ibuprofen (ADVIL) 800 MG tablet Take 1 tablet (800 mg total) by mouth 3 (three) times daily. 01/20/22   Milton Ferguson, MD  risperiDONE (RISPERDAL) 3 MG tablet Take 1 tablet (3 mg total) by mouth 2 (two) times daily. 06/19/18   Johnn Hai, MD  SUMAtriptan (IMITREX) 50 MG tablet Take 1 tablet by mouth as needed for migraine. May repeat in 2 hours if headache persists or recurs. (Please do not take more than 2 tablets in 24 hours.). 01/13/22   Wilnette Kales, PA                                                                                                                                    Past Surgical History History reviewed. No pertinent surgical history. Family History  History reviewed. No pertinent family history.  Social History Social History   Tobacco Use   Smoking status: Never   Smokeless tobacco: Never   Tobacco comments:    socially  Vaping Use   Vaping Use: Never used  Substance Use Topics   Alcohol use: Not Currently    Comment: drinks 2 days a week, beer, wine & liquor, binge drinking   Drug use: Never   Allergies Patient has no known allergies.  Review of Systems Review of Systems  Neurological:  Positive for headaches.    Physical Exam Vital Signs  I have reviewed the triage vital signs BP 111/75 (BP Location: Right Arm)   Pulse 75   Temp 98.2 F (36.8 C) (Oral)   Resp 18   LMP 01/25/2022 (Exact Date)   SpO2 99%   Physical Exam Vitals and nursing note reviewed.  Constitutional:      General: She is not in acute distress.    Appearance: She is well-developed.  HENT:     Head: Normocephalic and atraumatic.  Eyes:     Conjunctiva/sclera: Conjunctivae normal.  Cardiovascular:     Rate and Rhythm: Normal rate and  regular rhythm.     Heart sounds: No murmur heard. Pulmonary:     Effort: Pulmonary effort is normal. No respiratory distress.     Breath sounds: Normal breath sounds.  Abdominal:     Palpations: Abdomen is soft.     Tenderness: There is no abdominal tenderness.  Musculoskeletal:        General: No swelling.     Cervical back: Neck supple.  Skin:    General: Skin is warm and dry.     Capillary Refill: Capillary refill takes less than 2 seconds.  Neurological:     Mental Status: She is alert.     Cranial Nerves: No cranial nerve deficit.     Sensory: No sensory deficit.     Motor: No weakness.  Psychiatric:        Mood and Affect: Mood normal.     ED Results and Treatments Labs (all labs ordered are listed, but only abnormal results are displayed) Labs Reviewed - No data to display                                                                                                                        Radiology No results found.  Pertinent labs & imaging results that were available during my care of the patient were reviewed by me and considered in my medical decision making (see MDM for details).  Medications Ordered in ED Medications  dexamethasone (DECADRON) 10 MG/ML injection for Pediatric ORAL use 10 mg (10 mg Oral Given 01/27/22 1014)  diphenhydrAMINE (BENADRYL) capsule 25 mg (25 mg Oral Given 01/27/22 1014)  Procedures Procedures  (including critical care time)  Medical Decision Making / ED Course   This patient presents to the ED for concern of headache, this involves an extensive number of treatment options, and is a complaint that carries with it a high risk of complications and morbidity.  The differential diagnosis includes chronic migraine headache, dehydration, tension headache, caffeine withdrawal headache, malingering, secondary  gain  MDM: Patient seen emergency room for evaluation of headache.  Physical exam is unremarkable.  Patient received a dose of Decadron and diphenhydramine orally and symptoms completely resolved.  I did have a discussion with the patient that repeated doses of steroids would likely worsen her delusions and underlying psychiatric behavior and she needs to be very careful with this.  Patient then discharged with outpatient neurologic follow-up.  With normal neurologic exam and previous normal imaging with the exact same complaints, I have very low suspicion for acute intracranial pathology at this time.   Additional history obtained:  -External records from outside source obtained and reviewed including: Chart review including previous notes, labs, imaging, consultation notes    Medicines ordered and prescription drug management: Meds ordered this encounter  Medications   dexamethasone (DECADRON) 10 MG/ML injection for Pediatric ORAL use 10 mg   diphenhydrAMINE (BENADRYL) capsule 25 mg    -I have reviewed the patients home medicines and have made adjustments as needed  Critical interventions none   Cardiac Monitoring: The patient was maintained on a cardiac monitor.  I personally viewed and interpreted the cardiac monitored which showed an underlying rhythm of: NSR  Social Determinants of Health:  Factors impacting patients care include: frequent ED visits   Reevaluation: After the interventions noted above, I reevaluated the patient and found that they have :improved  Co morbidities that complicate the patient evaluation  Past Medical History:  Diagnosis Date   Anxiety    Depression       Dispostion: I considered admission for this patient, but she does not meet inpatient criteria for admission she is safe for discharge to outpatient follow-up     Final Clinical Impression(s) / ED Diagnoses Final diagnoses:  Acute nonintractable headache, unspecified headache type      @PCDICTATION @    , MD 01/27/22 1034

## 2022-01-27 NOTE — ED Triage Notes (Signed)
Still has a headache. Seen twice yesterday for same. Says it is much better after steroids and benadryl but may need more medicine.

## 2022-01-27 NOTE — ED Provider Notes (Signed)
[facility]-EMERGENCY DEPT Provider Note   CSN: [id number] Arrival date & time: 01/27/22  1942     History  Chief Complaint  Patient presents with   Headache    Amanda Burnett is a 36 y.o. 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  Patient presents to ED for evaluation of headache.  This is the patient's 14 visit since 01/13/2022 for headache.  The patient was seen this morning and discharged at 10:45 AM after receiving Decadron, Benadryl.  Patient previous presentations involved similar headaches as today.  Patient reports she is unable to follow-up with neurology because of her name being stolen.  Patient here requesting dose of steroids, Benadryl.  Patient states that this is a previous presentation to past headaches.  Patient states opioids only thing that helps her.  Patient denies numbness, tingling, weakness or other neurocomplaints.  Patient denies any nausea or vomiting, one-sided weakness or numbness.   Headache      Home Medications Prior to Admission medications   Medication Sig Start Date End Date Taking? Authorizing Provider  ARIPiprazole ER (ABILIFY MAINTENA) 400 MG SRER injection Inject 2 mLs (400 mg total) into the muscle every 28 (twenty-eight) days. 06/19/18   Malvin Johns, MD  benztropine (COGENTIN) 0.5 MG tablet Take 1 tablet (0.5 mg total) by mouth 2 (two) times daily. 06/19/18   Malvin Johns, MD  cyclobenzaprine (FLEXERIL) 10 MG tablet Take 1 tablet (10 mg total) by mouth 2 (two) times daily as needed for muscle spasms. 01/20/22   Bethann Berkshire, MD  dicyclomine (BENTYL) 20 MG tablet Take 1 tablet (20 mg total) by mouth 2 (two) times daily. 12/31/21   Linwood Dibbles, MD  etodolac (LODINE) 300 MG capsule Take 1 capsule (300 mg total) by mouth every 8 (eight) hours as needed for pain 12/31/21   Linwood Dibbles, MD  ibuprofen (ADVIL) 800 MG tablet Take 1 tablet (800 mg total) by  mouth 3 (three) times daily. 01/20/22   Bethann Berkshire, MD  risperiDONE (RISPERDAL) 3 MG tablet Take 1 tablet (3 mg total) by mouth 2 (two) times daily. 06/19/18   Malvin Johns, MD  SUMAtriptan (IMITREX) 50 MG tablet Take 1 tablet by mouth as needed for migraine. May repeat in 2 hours if headache persists or recurs. (Please do not take more than 2 tablets in 24 hours.). 01/13/22   Peter Garter, PA      Allergies    Patient has no known allergies.    Review of Systems   Review of Systems  Neurological:  Positive for headaches.  All other systems reviewed and are negative.   Physical Exam Updated Vital Signs BP (!) 143/88 (BP Location: Right Arm)   Pulse (!) 110   Temp 98.2 F (36.8 C) (Oral)   Resp 18   LMP 01/25/2022 (Exact Date)   SpO2 100%  Physical Exam Vitals and nursing note reviewed.  Constitutional:      General: She is not in acute distress.    Appearance: She is well-developed.  HENT:     Head: Normocephalic and atraumatic.  Eyes:     Conjunctiva/sclera: Conjunctivae normal.  Cardiovascular:     Rate and Rhythm: Normal rate and regular rhythm.     Heart sounds: No murmur heard. Pulmonary:     Effort: Pulmonary effort is normal. No respiratory distress.     Breath sounds: Normal breath sounds.  Abdominal:     Palpations: Abdomen is soft.  Tenderness: There is no abdominal tenderness.  Musculoskeletal:        General: No swelling.     Cervical back: Neck supple.  Skin:    General: Skin is warm and dry.     Capillary Refill: Capillary refill takes less than 2 seconds.  Neurological:     General: No focal deficit present.     Mental Status: She is alert.     GCS: GCS eye subscore is 4. GCS verbal subscore is 5. GCS motor subscore is 6.     Cranial Nerves: Cranial nerves 2-12 are intact. No cranial nerve deficit.     Sensory: Sensation is intact. No sensory deficit.     Motor: Motor function is intact. No weakness.     Coordination: Coordination is  intact. Heel to [facility] Test normal.     Comments: Cranial nerves II through XII intact.  Intact finger-nose, heel-to-shin.  No pronator drift.  No facial droop, no slurred speech.  Patient can perform rapid alternating movements.  Patient has 5 out of 5 strength upper and lower extremities bilaterally.  Psychiatric:        Mood and Affect: Mood normal.     ED Results / Procedures / Treatments   Labs (all labs ordered are listed, but only abnormal results are displayed) Labs Reviewed - No data to display  EKG None  Radiology No results found.  Procedures Procedures   Medications Ordered in ED Medications  diphenhydrAMINE (BENADRYL) capsule 25 mg (has no administration in time range)    ED Course/ Medical Decision Making/ A&P                           Medical Decision Making  36 year old female returns to ED for evaluation of headache.  Patient discharged this morning at 10:45 AM with unremarkable workup.  This the patient's 14th visit since 11/29 for similar complaint.  Patient denies any new features to headache.  Patient denies any recent trauma.  Patient here requesting more steroids, Benadryl.  Patient received 10 mg Decadron this morning.  The patient reports that she is unable to follow-up with neurology due to delusional thought content, stating that her name has been stolen.  Advised the patient that from an emergency medicine standpoint her workup is been reassuring thus far.  Patient denies any new features to her headaches so I do not see any need for reevaluation.  Patient requesting steroids and Benadryl.  I advised the patient that we cannot provide her steroids however we will provide her Benadryl.  The patient was advised that she needs to follow-up with neurology.  The patient was given return precautions and she voiced understanding.  The patient other questions answered to her satisfaction.  The patient is stable for discharge.  Final Clinical Impression(s) / ED  Diagnoses Final diagnoses:  Nonintractable headache, unspecified chronicity pattern, unspecified headache type    Rx / DC Orders ED Discharge Orders     None         Clent Ridges 01/27/22 1959    Terald Sleeper, MD 01/27/22 2001

## 2022-01-28 ENCOUNTER — Other Ambulatory Visit: Payer: Self-pay

## 2022-01-28 ENCOUNTER — Emergency Department (HOSPITAL_COMMUNITY)
Admission: EM | Admit: 2022-01-28 | Discharge: 2022-01-29 | Disposition: A | Discharge status: Home / Self Care | Payer: Medicaid Other | Attending: Emergency Medicine | Admitting: Emergency Medicine

## 2022-01-28 DIAGNOSIS — R21 Rash and other nonspecific skin eruption: Secondary | ICD-10-CM | POA: Diagnosis present

## 2022-01-28 DIAGNOSIS — L304 Erythema intertrigo: Secondary | ICD-10-CM | POA: Diagnosis not present

## 2022-01-28 NOTE — ED Triage Notes (Signed)
Patient reports skin irritation / burning sensation at bilateral inner thighs this evening .

## 2022-01-28 NOTE — ED Provider Triage Note (Signed)
Emergency Medicine Provider Triage Evaluation Note  Amanda Burnett , a 36 y.o. female  was evaluated in triage.  Pt complains of rash, noted today, in between her legs, bit irritated, no other complaints, no recent tick bites, no rashes on her upper extremities no tongue throat lip swelling difficulty breathing, no new medications no new environmental changes.  Review of Systems  Positive: Rash, pruritus Negative: Tongue throat lip swelling  Physical Exam  BP 113/87 (BP Location: Right Arm)   Pulse 84   Temp 98.9 F (37.2 C)   Resp 18   LMP 01/25/2022 (Exact Date)   SpO2 100%  Gen:   Awake, no distress   Resp:  Normal effort  MSK:   Moves extremities without difficulty  Other:    Medical Decision Making  Medically screening exam initiated at 10:57 PM.  Appropriate orders placed.  Amanda Burnett was informed that the remainder of the evaluation will be completed by another provider, this initial triage assessment does not replace that evaluation, and the importance of remaining in the ED until their evaluation is complete.  Will need further evaluation.   Carroll Sage, PA-C 01/28/22 2258

## 2022-01-29 NOTE — Discharge Instructions (Addendum)
Thank you for letting us take care of you today.  If your rash gets worse or you develop fever, chills, open wounds, or other concerns, please be re-evaluated.   I have provided the names of 2 primary care clinics you  may contact to arrange follow-up in clinic as needed.

## 2022-01-29 NOTE — ED Provider Notes (Signed)
[facility] EMERGENCY DEPARTMENT Provider Note   CSN: [id number] Arrival date & time: 01/28/22  2214     History  Chief Complaint  Patient presents with   Rash    Amanda Burnett is a 36 y.o. female who presents to ED c/o rash to bilateral medial thighs that she noticed yesterday. Pt states she is wearing new pair of pants and was out shopping yesterday normally when she noticed "burning and itching" to the thighs with some warmth and erythema. She denies fever, chills, nausea, vomiting, rash elsewhere on her body, recent medication change, recent new exposure, known tick or insect bite, lesions/wounds, shortness of breath, throat swelling or trouble swallowing, or new products. States that otherwise she is feeling in her normal state of health and just thought she would come to get checked out in case it was something.      Home Medications Prior to Admission medications   Medication Sig Start Date End Date Taking? Authorizing Provider  ARIPiprazole ER (ABILIFY MAINTENA) 400 MG SRER injection Inject 2 mLs (400 mg total) into the muscle every 28 (twenty-eight) days. 06/19/18   Malvin Johns, MD  benztropine (COGENTIN) 0.5 MG tablet Take 1 tablet (0.5 mg total) by mouth 2 (two) times daily. 06/19/18   Malvin Johns, MD  cyclobenzaprine (FLEXERIL) 10 MG tablet Take 1 tablet (10 mg total) by mouth 2 (two) times daily as needed for muscle spasms. 01/20/22   Bethann Berkshire, MD  dicyclomine (BENTYL) 20 MG tablet Take 1 tablet (20 mg total) by mouth 2 (two) times daily. 12/31/21   Linwood Dibbles, MD  etodolac (LODINE) 300 MG capsule Take 1 capsule (300 mg total) by mouth every 8 (eight) hours as needed for pain 12/31/21   Linwood Dibbles, MD  ibuprofen (ADVIL) 800 MG tablet Take 1 tablet (800 mg total) by mouth 3 (three) times daily. 01/20/22   Bethann Berkshire, MD  risperiDONE (RISPERDAL) 3 MG tablet Take 1 tablet (3 mg total) by mouth 2 (two) times daily. 06/19/18   Malvin Johns, MD   SUMAtriptan (IMITREX) 50 MG tablet Take 1 tablet by mouth as needed for migraine. May repeat in 2 hours if headache persists or recurs. (Please do not take more than 2 tablets in 24 hours.). 01/13/22   Peter Garter, PA      Allergies    Patient has no known allergies.    Review of Systems   Review of Systems  Constitutional:  Negative for activity change, appetite change, chills and fever.  HENT:  Negative for ear pain and sore throat.   Eyes:  Negative for pain and visual disturbance.  Respiratory:  Negative for cough and shortness of breath.   Cardiovascular:  Negative for chest pain and palpitations.  Gastrointestinal:  Negative for abdominal pain, diarrhea, nausea and vomiting.  Genitourinary:  Negative for dysuria and hematuria.  Musculoskeletal:  Negative for arthralgias and back pain.  Skin:  Positive for rash.  Neurological:  Negative for seizures and syncope.  Psychiatric/Behavioral:  Negative for agitation and behavioral problems.   All other systems reviewed and are negative.   Physical Exam Updated Vital Signs BP 110/74 (BP Location: Right Arm)   Pulse 80   Temp 98.4 F (36.9 C) (Oral)   Resp 15   LMP 01/25/2022 (Exact Date)   SpO2 100%  Physical Exam Vitals and nursing note reviewed.  Constitutional:      General: She is not in acute distress.    Appearance: Normal appearance. She  is not ill-appearing, toxic-appearing or diaphoretic.  HENT:     Head: Normocephalic and atraumatic.     Mouth/Throat:     Mouth: Mucous membranes are moist.  Eyes:     Conjunctiva/sclera: Conjunctivae normal.  Cardiovascular:     Rate and Rhythm: Normal rate and regular rhythm.     Heart sounds: No murmur heard. Pulmonary:     Effort: Pulmonary effort is normal.     Breath sounds: Normal breath sounds. No wheezing, rhonchi or rales.  Abdominal:     General: Abdomen is flat.     Palpations: Abdomen is soft.     Tenderness: There is no abdominal tenderness.   Musculoskeletal:        General: Normal range of motion.     Cervical back: Neck supple.     Right lower leg: No edema.     Left lower leg: No edema.  Skin:    General: Skin is warm and dry.     Capillary Refill: Capillary refill takes less than 2 seconds.     Comments: Mild erythema, increased warmth, and excoriations to bilateral medial thighs without any lesions, open wounds, increased moisture, or signs of insect bites, most consistent with chafing vs contact dermatitis  Neurological:     Mental Status: She is alert. Mental status is at baseline.  Psychiatric:        Behavior: Behavior normal.     ED Results / Procedures / Treatments   Labs (all labs ordered are listed, but only abnormal results are displayed) Labs Reviewed - No data to display  EKG None  Radiology No results found.  Procedures None  Medications Ordered in ED Medications - No data to display  ED Course/ Medical Decision Making/ A&P                           Medical Decision Making  This is a 36 year old female with a history of frequent ED visits presenting for evaluation of a rash to bilateral medial thighs that began after shopping yesterday. She has mildly warm, erythematous rash to bilateral medial thighs without signs of open wounds, lesions, insect bites, streaking erythema, or abscess. Describes rash as burning and pruritic with signs of excoriation so suspect chafing vs contact dermatitis. No new identifiable products or exposures but pt does state she is wearing new pair of pants that are causing irritation to thighs and show visible indentation marks around area of rash. Suspect this is major contributor to symptoms. No recent fever, chills, abdominal pain, nausea, vomiting, or other symptoms pointing to infectious process or underlying cellulitis, no signs of increased moisture or drainage consistent with fungal infection, and no bite marks to suggest tick bite, bed bugs, or other infestation.  Discussed with pt rash appears benign at this time and can be managed with conservative treatment such as over the counter chafing creams and cool compresses and she was given instructions to be re-evaluations for worsening of rash, fever, chills, or other concerning symptoms. All questions answered.          Final Clinical Impression(s) / ED Diagnoses Final diagnoses:  Chafing    Rx / DC Orders ED Discharge Orders     None         Tonette Lederer, PA-C 01/29/22 0805    Gerhard Munch, MD 01/29/22 [phone]

## 2022-01-29 NOTE — ED Notes (Signed)
Discharge instructions reviewed with patient. Patient denies any questions or concerns. Patient given bus pass and [place] sandwich, patient ambulatory out of ED.

## 2022-01-30 ENCOUNTER — Emergency Department (HOSPITAL_COMMUNITY)
Admission: EM | Admit: 2022-01-30 | Discharge: 2022-01-30 | Disposition: A | Discharge status: Home / Self Care | Payer: Medicaid Other | Attending: Emergency Medicine | Admitting: Emergency Medicine

## 2022-01-30 ENCOUNTER — Other Ambulatory Visit: Payer: Self-pay

## 2022-01-30 ENCOUNTER — Emergency Department (HOSPITAL_COMMUNITY)
Admission: EM | Admit: 2022-01-30 | Discharge: 2022-01-31 | Disposition: A | Discharge status: Home / Self Care | Payer: Medicaid Other | Source: Home / Self Care | Attending: Emergency Medicine | Admitting: Emergency Medicine

## 2022-01-30 ENCOUNTER — Encounter (HOSPITAL_COMMUNITY): Payer: Self-pay

## 2022-01-30 DIAGNOSIS — R519 Headache, unspecified: Secondary | ICD-10-CM

## 2022-01-30 DIAGNOSIS — Z5900 Homelessness unspecified: Secondary | ICD-10-CM | POA: Insufficient documentation

## 2022-01-30 DIAGNOSIS — F22 Delusional disorders: Secondary | ICD-10-CM | POA: Insufficient documentation

## 2022-01-30 MED ORDER — IBUPROFEN 200 MG PO TABS
600.0000 mg | ORAL_TABLET | Freq: Once | ORAL | Status: AC
  Administered 2022-01-30: 600 mg via ORAL
  Filled 2022-01-30: qty 3

## 2022-01-30 NOTE — Discharge Instructions (Signed)
Recommend over-the-counter pain medication as needed.  Please follow-up with community health and wellness  Come back to the emergency department if you develop chest pain, shortness of breath, severe abdominal pain, uncontrolled nausea, vomiting, diarrhea.

## 2022-01-30 NOTE — ED Provider Notes (Signed)
[facility]-EMERGENCY DEPT Provider Note   CSN: [id number] Arrival date & time: 01/30/22  0040     History  Chief Complaint  Patient presents with   Headache    Amanda Burnett is a 36 y.o. female.  HPI   Patient with medical history including anxiety, paranoia homeless presents with complaints of headache.  Patient states that today she started to have a fever and then developed a headache, headache is just around her head, there is no associated paresthesias or weakness the upper or lower extremities, no change in vision, she has this feels like her normal headache.  She states that she has had been having general body aches but no congestion cough, denies any recent sick contacts.  She states that she just wants to come in to be checked out.  I reviewed patient's chart she has been seen multiple times, most recent was seen yesterday for a rash, she has been seen multiple times for headaches, she has had CT scans which was unremarkable, patient has not follow-up with neurology has been advised.  Home Medications Prior to Admission medications   Medication Sig Start Date End Date Taking? Authorizing Provider  ARIPiprazole ER (ABILIFY MAINTENA) 400 MG SRER injection Inject 2 mLs (400 mg total) into the muscle every 28 (twenty-eight) days. 06/19/18   Malvin Johns, MD  benztropine (COGENTIN) 0.5 MG tablet Take 1 tablet (0.5 mg total) by mouth 2 (two) times daily. 06/19/18   Malvin Johns, MD  cyclobenzaprine (FLEXERIL) 10 MG tablet Take 1 tablet (10 mg total) by mouth 2 (two) times daily as needed for muscle spasms. 01/20/22   Bethann Berkshire, MD  dicyclomine (BENTYL) 20 MG tablet Take 1 tablet (20 mg total) by mouth 2 (two) times daily. 12/31/21   Linwood Dibbles, MD  etodolac (LODINE) 300 MG capsule Take 1 capsule (300 mg total) by mouth every 8 (eight) hours as needed for pain 12/31/21   Linwood Dibbles, MD  ibuprofen (ADVIL) 800 MG tablet Take 1 tablet (800 mg total) by mouth 3  (three) times daily. 01/20/22   Bethann Berkshire, MD  risperiDONE (RISPERDAL) 3 MG tablet Take 1 tablet (3 mg total) by mouth 2 (two) times daily. 06/19/18   Malvin Johns, MD  SUMAtriptan (IMITREX) 50 MG tablet Take 1 tablet by mouth as needed for migraine. May repeat in 2 hours if headache persists or recurs. (Please do not take more than 2 tablets in 24 hours.). 01/13/22   Peter Garter, PA      Allergies    Patient has no known allergies.    Review of Systems   Review of Systems  Constitutional:  Negative for chills and fever.  Respiratory:  Negative for shortness of breath.   Cardiovascular:  Negative for chest pain.  Gastrointestinal:  Negative for abdominal pain.  Neurological:  Positive for headaches.    Physical Exam Updated Vital Signs BP 103/77 (BP Location: Right Arm)   Pulse 82   Temp 98 F (36.7 C) (Oral)   Resp 16   LMP 01/25/2022 (Exact Date)   SpO2 100%  Physical Exam Vitals and nursing note reviewed.  Constitutional:      General: She is not in acute distress.    Appearance: She is not ill-appearing.  HENT:     Head: Normocephalic and atraumatic.     Nose: No congestion.  Eyes:     Conjunctiva/sclera: Conjunctivae normal.  Cardiovascular:     Rate and Rhythm: Normal rate and regular rhythm.  Pulses: Normal pulses.  Pulmonary:     Effort: Pulmonary effort is normal.  Skin:    General: Skin is warm and dry.  Neurological:     Mental Status: She is alert.     GCS: GCS eye subscore is 4. GCS verbal subscore is 5.     Cranial Nerves: Cranial nerves 2-12 are intact. No cranial nerve deficit.     Sensory: Sensation is intact.     Motor: No weakness.     Coordination: Coordination is intact. Romberg sign negative.     Gait: Gait is intact.     Comments: Cranial nerves II through XII grossly intact no difficulty with word finding,  following two-step commands there is no unilateral weakness present.  Psychiatric:        Mood and Affect: Mood normal.      ED Results / Procedures / Treatments   Labs (all labs ordered are listed, but only abnormal results are displayed) Labs Reviewed - No data to display  EKG None  Radiology No results found.  Procedures Procedures    Medications Ordered in ED Medications  ibuprofen (ADVIL) tablet 600 mg (600 mg Oral Given 01/30/22 0532)    ED Course/ Medical Decision Making/ A&P                           Medical Decision Making Risk OTC drugs.   This patient presents to the ED for concern of HA, this involves an extensive number of treatment options, and is a complaint that carries with it a high risk of complications and morbidity.  The differential diagnosis includes intracranial head bleed, head mass, CVA    Additional history obtained:  Additional history obtained from N/A External records from outside source obtained and reviewed including recent ER notes   Co morbidities that complicate the patient evaluation  N/A  Social Determinants of Health:  Homeless    Lab Tests:  I Ordered, and personally interpreted labs.  The pertinent results include:  n/a   Imaging Studies ordered:  I ordered imaging studies including n/a  I independently visualized and interpreted imaging which showed n/a I agree with the radiologist interpretation   Cardiac Monitoring:  The patient was maintained on a cardiac monitor.  I personally viewed and interpreted the cardiac monitored which showed an underlying rhythm of: n/a   Medicines ordered and prescription drug management:  I ordered medication including ibuprofen I have reviewed the patients home medicines and have made adjustments as needed  Critical Interventions:  N/A   Reevaluation:  Presents with a headache, benign physical exam, will provide with ibuprofen and reassess  Has no complaints agreement for discharge at this time.  Consultations Obtained:  N/a    Test Considered:  CT head-deferred as my  suspicion for intracranial bleed is low at this time no traumatic injury not on anticoag's.    Rule out  Low suspicion for CVA she has no focal deficit present my exam.  Low suspicion for dissection of the vertebral or carotid artery as presentation atypical of etiology.  Low suspicion for meningitis as she has no meningeal sign present.     Dispostion and problem list  After consideration of the diagnostic results and the patients response to treatment, I feel that the patent would benefit from DC.  Headache-tension like headache as well as malingering behavior, recommend taking over-the-counter pain medications follow-up with neurology for further evaluation.  Final Clinical Impression(s) / ED Diagnoses Final diagnoses:  Bad headache    Rx / DC Orders ED Discharge Orders     None         Carroll Sage, PA-C 01/30/22 0620    Melene Plan, DO 01/30/22 [phone]

## 2022-01-30 NOTE — ED Triage Notes (Signed)
States that she is having a bad HA and tylenol is not working

## 2022-01-30 NOTE — ED Triage Notes (Addendum)
Patient coming to ED for evaluation of headache.  Reports pain start after eating lunch.  No reports of nausea or vomiting.  No blurred vision.

## 2022-01-31 ENCOUNTER — Emergency Department (HOSPITAL_COMMUNITY)
Admission: EM | Admit: 2022-01-31 | Discharge: 2022-01-31 | Disposition: A | Discharge status: Home / Self Care | Payer: Medicaid Other | Attending: Emergency Medicine | Admitting: Emergency Medicine

## 2022-01-31 ENCOUNTER — Encounter (HOSPITAL_COMMUNITY): Payer: Self-pay

## 2022-01-31 ENCOUNTER — Other Ambulatory Visit: Payer: Self-pay

## 2022-01-31 DIAGNOSIS — R519 Headache, unspecified: Secondary | ICD-10-CM | POA: Diagnosis not present

## 2022-01-31 DIAGNOSIS — G8929 Other chronic pain: Secondary | ICD-10-CM

## 2022-01-31 LAB — BASIC METABOLIC PANEL
Anion gap: 8 (ref 5–15)
BUN: 9 mg/dL (ref 6–20)
CO2: 23 mmol/L (ref 22–32)
Calcium: 8.7 mg/dL — ABNORMAL LOW (ref 8.9–10.3)
Chloride: 107 mmol/L (ref 98–111)
Creatinine, Ser: 1.07 mg/dL — ABNORMAL HIGH (ref 0.44–1.00)
GFR, Estimated: >60 mL/min (ref >60)
Glucose, Bld: 90 mg/dL (ref 70–99)
Potassium: 3.9 mmol/L (ref 3.5–5.1)
Sodium: 138 mmol/L (ref 135–145)

## 2022-01-31 LAB — CBC
HCT: 43.1 % (ref 36.0–46.0)
Hemoglobin: 14.2 g/dL (ref 12.0–15.0)
MCH: 31.1 pg (ref 26.0–34.0)
MCHC: 32.9 g/dL (ref 30.0–36.0)
MCV: 94.3 fL (ref 80.0–100.0)
Platelets: 320 10*3/uL (ref 150–400)
RBC: 4.57 MIL/uL (ref 3.87–5.11)
RDW: 13.1 % (ref 11.5–15.5)
WBC: 5.1 10*3/uL (ref 4.0–10.5)
nRBC: 0 % (ref 0.0–0.2)

## 2022-01-31 MED ORDER — KETOROLAC TROMETHAMINE 15 MG/ML IJ SOLN
15.0000 mg | Freq: Once | INTRAMUSCULAR | Status: DC
  Filled 2022-01-31: qty 1

## 2022-01-31 MED ORDER — HYDROXYZINE HCL 25 MG PO TABS
25.0000 mg | ORAL_TABLET | Freq: Once | ORAL | Status: AC
  Administered 2022-01-31: 25 mg via ORAL
  Filled 2022-01-31: qty 1

## 2022-01-31 NOTE — ED Notes (Signed)
Attempted to medicate patient with IM Toradol that was ordered. Patient asked if she could drink it and this writer advised her that it was an injection. Patient refuses IM pain management stating " I don't take any medication unless its taken by mouth. I do not let anybody shoot me up with medication. Not in a shot or IV." Patient then states that even in a medical emergency she would not want life saving IV medications to be put into her body because she is trying to " preserve her body."

## 2022-01-31 NOTE — ED Triage Notes (Signed)
Pt arrived POV w/ c/o headache. Pt reports it has been going on for 2 weeks now w/ no relief. Pt has tried tylenol, hot drinks, quietness, running, listening to music. Pt reports photophobia. Pt reports CP, abd pain, knee pain, hoarseness, bilateral feet swelling, chills.

## 2022-01-31 NOTE — Discharge Instructions (Addendum)
You are seen today for headache.  Take ibuprofen at home as needed.  If you need psychiatric resources or feel you are in crisis, you should go to behavioral health urgent care.

## 2022-01-31 NOTE — ED Notes (Signed)
Pt cursing and talking to herself. Pt refused dc VS

## 2022-01-31 NOTE — ED Provider Notes (Signed)
[facility]-EMERGENCY DEPT Provider Note   CSN: [id number] Arrival date & time: 01/30/22  2116     History  Chief Complaint  Patient presents with   Headache    Amanda Burnett is a 36 y.o. female.  HPI     This is a 37 year old female who presents for report of headache.  She has been seen and evaluated for the same multiple times over the last several days.  She has had innumerable ED visits over the last several months.  Patient states that she has a headache.  She states she has been taking Tylenol with minimal relief.  She reports photophobia.  She states it is not abnormal for her to have headache.  She reports increased stress at home.  She then began to talk about how she was given that as a child.  She states that she was really 19 and not 36.  She reports that she is the daughter of Amanda Burnett and Amanda Burnett.  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  Denies any self-harm.  I reviewed the patient's chart.  She did have a behavioral Burnett admission in 2020 although there are no formal psychiatric diagnoses on her chart.  At that time she was there for psychotic symptoms.  She reportedly had been having several years of hallucinations and delusions.  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  It does not appear that she has since had any ongoing treatment with the exception of innumerable ED visits for various complaints.  Home Medications Prior to Admission medications   Medication Sig Start Date End Date Taking? Authorizing Provider  ARIPiprazole ER (ABILIFY MAINTENA) 400 MG SRER injection Inject 2 mLs (400 mg total) into the muscle every 28 (twenty-eight) days. 06/19/18   Malvin Johns, MD  benztropine (COGENTIN) 0.5 MG tablet Take 1 tablet (0.5 mg total) by mouth 2 (two) times daily. 06/19/18    Malvin Johns, MD  cyclobenzaprine (FLEXERIL) 10 MG tablet Take 1 tablet (10 mg total) by mouth 2 (two) times daily as needed for muscle spasms. 01/20/22   Bethann Berkshire, MD  dicyclomine (BENTYL) 20 MG tablet Take 1 tablet (20 mg total) by mouth 2 (two) times daily. 12/31/21   Linwood Dibbles, MD  etodolac (LODINE) 300 MG capsule Take 1 capsule (300 mg total) by mouth every 8 (eight) hours as needed for pain 12/31/21   Linwood Dibbles, MD  ibuprofen (ADVIL) 800 MG tablet Take 1 tablet (800 mg total) by mouth 3 (three) times daily. 01/20/22   Bethann Berkshire, MD  risperiDONE (RISPERDAL) 3 MG tablet Take 1 tablet (3 mg total) by mouth 2 (two) times daily. 06/19/18   Malvin Johns, MD  SUMAtriptan (IMITREX) 50 MG tablet Take 1 tablet by mouth as needed for migraine. May repeat in 2 hours if headache persists or recurs. (Please do not take more than 2 tablets in 24 hours.). 01/13/22   Peter Garter, PA      Allergies    Patient has no known allergies.    Review of Systems   Review of Systems  Neurological:  Positive for headaches.  Psychiatric/Behavioral:  Positive for hallucinations. Negative for suicidal ideas.   All other systems reviewed and are negative.   Physical Exam Updated Vital Signs BP (!) 96/56   Pulse (!) 101   Temp  98.6 F (37 C) (Oral)   Resp 17   Ht 1.676 m (5\' 6" )   Wt 73 kg   LMP 01/25/2022 (Exact Date)   SpO2 98%   BMI 25.99 kg/m  Physical Exam Vitals and nursing note reviewed.  Constitutional:      Appearance: She is well-developed. She is not ill-appearing.  HENT:     Head: Normocephalic and atraumatic.  Eyes:     Pupils: Pupils are equal, round, and reactive to light.  Cardiovascular:     Rate and Rhythm: Normal rate and regular rhythm.     Heart sounds: Normal heart sounds.  Pulmonary:     Effort: Pulmonary effort is normal. No respiratory distress.     Breath sounds: No wheezing.  Abdominal:     General: Bowel sounds are normal.     Palpations: Abdomen is  soft.  Musculoskeletal:     Cervical back: Neck supple.  Skin:    General: Skin is warm and dry.  Neurological:     Mental Status: She is alert and oriented to person, place, and time.     Comments: Fluent speech, cranial nerves II through XII intact, 5-5 strength in all 4 extremities  Psychiatric:     Comments: Fixed delusions regarding kidnapping and her being a judge, no suicidal ideation     ED Results / Procedures / Treatments   Labs (all labs ordered are listed, but only abnormal results are displayed) Labs Reviewed - No data to display  EKG None  Radiology No results found.  Procedures Procedures    Medications Ordered in ED Medications  ketorolac (TORADOL) 15 MG/ML injection 15 mg (15 mg Intramuscular Patient Refused/Not Given 01/31/22 0223)    ED Course/ Medical Decision Making/ A&P                           Medical Decision Making Risk Prescription drug management.   This patient presents to the ED for concern of headache, this involves an extensive number of treatment options, and is a complaint that carries with it a high risk of complications and morbidity.  I considered the following differential and admission for this acute, potentially life threatening condition.  The differential diagnosis includes tension headache, migraine headache, ongoing psychosis  MDM:    This is a 36 year old female who presents with reported headache.  She is nontoxic.  Vital signs notable for mild tachycardia.  Denies any other systemic symptoms.  Neurologically intact.  More prominently she appears to have some fixed delusions.  Highly suspect she is schizophrenic although she has not had any recent behavioral Burnett admissions and does not have any ongoing psychiatric medications..  She has been highly resistant to psychiatric care in the past.  She does not appear to be in acute harm to herself but does have significant delusion.  Patient was offered psychiatric support;  however, refused.  She was provided with outpatient resources.  I attempted treatment for her headache but she refused.  Stating that she did not want any medications in her body.  I have low suspicion for acute emergency.    (Labs, imaging, consults)  Labs: I Ordered, and personally interpreted labs.  The pertinent results include: None  Imaging Studies ordered: I ordered imaging studies including none I independently visualized and interpreted imaging. I agree with the radiologist interpretation  Additional history obtained from chart review.  External records from outside source obtained and reviewed including prior ED  visits and outside ED visits behavioral Burnett notes  Cardiac Monitoring: The patient was maintained on a cardiac monitor.  I personally viewed and interpreted the cardiac monitored which showed an underlying rhythm of: [place] rhythm  Reevaluation: After the interventions noted above, I reevaluated the patient and found that they have :stayed the same  Social Determinants of Burnett:  psychosis  Disposition: Discharge  Co morbidities that complicate the patient evaluation  Past Medical History:  Diagnosis Date   Anxiety    Depression      Medicines Meds ordered this encounter  Medications   ketorolac (TORADOL) 15 MG/ML injection 15 mg    I have reviewed the patients home medicines and have made adjustments as needed  Problem List / ED Course: Problem List Items Addressed This Visit   None Visit Diagnoses     Bad headache    -  Primary   Relevant Medications   ketorolac (TORADOL) 15 MG/ML injection 15 mg   Delusion (HCC)                       Final Clinical Impression(s) / ED Diagnoses Final diagnoses:  Bad headache  Delusion (HCC)    Rx / DC Orders ED Discharge Orders     None         Messiah Ahr, Mayer Masker, MD 01/31/22 [phone]

## 2022-01-31 NOTE — ED Provider Notes (Signed)
[facility] EMERGENCY DEPARTMENT Provider Note   CSN: [id number] Arrival date & time: 01/31/22  1617     History  Chief Complaint  Patient presents with   Headache   multiple complaints    Amanda Burnett is a 36 y.o. female.  36 year old female who presents with ongoing headache for quite some time.  Patient has long history of same.  Had multiple ED visits and multiple workups for this without etiology.  Patient does appear to be somewhat anxious at this time.  She denies any SI or HI.  States that headache is the same as he is always had.  Has not been throwing up.  Has been seen by neurology and is working with them to find relief for headache.  States that hydrocodone helps with her headache       Home Medications Prior to Admission medications   Medication Sig Start Date End Date Taking? Authorizing Provider  ARIPiprazole ER (ABILIFY MAINTENA) 400 MG SRER injection Inject 2 mLs (400 mg total) into the muscle every 28 (twenty-eight) days. 06/19/18   Malvin Johns, MD  benztropine (COGENTIN) 0.5 MG tablet Take 1 tablet (0.5 mg total) by mouth 2 (two) times daily. 06/19/18   Malvin Johns, MD  cyclobenzaprine (FLEXERIL) 10 MG tablet Take 1 tablet (10 mg total) by mouth 2 (two) times daily as needed for muscle spasms. 01/20/22   Bethann Berkshire, MD  dicyclomine (BENTYL) 20 MG tablet Take 1 tablet (20 mg total) by mouth 2 (two) times daily. 12/31/21   Linwood Dibbles, MD  etodolac (LODINE) 300 MG capsule Take 1 capsule (300 mg total) by mouth every 8 (eight) hours as needed for pain 12/31/21   Linwood Dibbles, MD  ibuprofen (ADVIL) 800 MG tablet Take 1 tablet (800 mg total) by mouth 3 (three) times daily. 01/20/22   Bethann Berkshire, MD  risperiDONE (RISPERDAL) 3 MG tablet Take 1 tablet (3 mg total) by mouth 2 (two) times daily. 06/19/18   Malvin Johns, MD  SUMAtriptan (IMITREX) 50 MG tablet Take 1 tablet by mouth as needed for migraine. May repeat in 2 hours if headache persists or  recurs. (Please do not take more than 2 tablets in 24 hours.). 01/13/22   Peter Garter, PA      Allergies    Patient has no known allergies.    Review of Systems   Review of Systems  All other systems reviewed and are negative.   Physical Exam Updated Vital Signs BP 121/88   Pulse (!) 102   Temp 98 F (36.7 C)   Resp 15   Ht 1.676 m (5\' 6" )   Wt 73 kg   LMP 01/25/2022 (Exact Date)   SpO2 100%   BMI 25.99 kg/m  Physical Exam Vitals and nursing note reviewed.  Constitutional:      General: She is not in acute distress.    Appearance: Normal appearance. She is well-developed. She is not toxic-appearing.  HENT:     Head: Normocephalic and atraumatic.  Eyes:     General: Lids are normal.     Conjunctiva/sclera: Conjunctivae normal.     Pupils: Pupils are equal, round, and reactive to light.  Neck:     Thyroid: No thyroid mass.     Trachea: No tracheal deviation.  Cardiovascular:     Rate and Rhythm: Normal rate and regular rhythm.     Heart sounds: Normal heart sounds. No murmur heard.    No gallop.  Pulmonary:  Effort: Pulmonary effort is normal. No respiratory distress.     Breath sounds: Normal breath sounds. No stridor. No decreased breath sounds, wheezing, rhonchi or rales.  Abdominal:     General: There is no distension.     Palpations: Abdomen is soft.     Tenderness: There is no abdominal tenderness. There is no rebound.  Musculoskeletal:        General: No tenderness. Normal range of motion.     Cervical back: Normal range of motion and neck supple.  Skin:    General: Skin is warm and dry.     Findings: No abrasion or rash.  Neurological:     Mental Status: She is alert and oriented to person, place, and time. Mental status is at baseline.     GCS: GCS eye subscore is 4. GCS verbal subscore is 5. GCS motor subscore is 6.     Cranial Nerves: No cranial nerve deficit.     Sensory: No sensory deficit.     Motor: Motor function is intact.   Psychiatric:        Attention and Perception: Attention normal.        Speech: Speech normal.        Behavior: Behavior normal.     ED Results / Procedures / Treatments   Labs (all labs ordered are listed, but only abnormal results are displayed) Labs Reviewed  BASIC METABOLIC PANEL - Abnormal; Notable for the following components:      Result Value   Creatinine, Ser 1.07 (*)    Calcium 8.7 (*)    All other components within normal limits  CBC    EKG None  Radiology No results found.  Procedures Procedures    Medications Ordered in ED Medications  hydrOXYzine (ATARAX) tablet 25 mg (has no administration in time range)    ED Course/ Medical Decision Making/ A&P                           Medical Decision Making Risk Prescription drug management.   Patient appears to be anxious here.  Will give Vistaril.  Do not feel that opiates will be in the patient's best interest.  She has no focal neurological deficits.  Patient instructed to follow-up with neurology       Final Clinical Impression(s) / ED Diagnoses Final diagnoses:  None    Rx / DC Orders ED Discharge Orders     None         Lorre Nick, MD 01/31/22 1839

## 2022-01-31 NOTE — ED Provider Triage Note (Signed)
Emergency Medicine Provider Triage Evaluation Note  Amanda Burnett , a 36 y.o. female  was evaluated in triage.  Pt complains of Headache, abdominal pain, right knee pain.  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  Endorses some photophobia.  Patient medication noncompliant with her schizophrenia medications at this time.  She is denying any suicidal ideation, homicidal ideation.  She has been seen multiple times recently over the last several days.  She reports symptoms ongoing with no improvement with previous treatments.  She reports that she does not want any IV medication but is fine with blood draw.  Review of Systems  Positive: Headache, abdominal pain, knee pain, photophobia Negative: Fever, chills, chest pain  Physical Exam  BP 121/88   Pulse (!) 102   Temp 98 F (36.7 C)   Resp 15   Ht 5\' 6"  (1.676 m)   Wt 73 kg   LMP 01/25/2022 (Exact Date)   SpO2 100%   BMI 25.99 kg/m  Gen:   Awake, no distress   Resp:  Normal effort  MSK:   Moves extremities without difficulty  Other:  Rapid, pressured speech, delusions of grandeur, no SI, HI  Medical Decision Making  Medically screening exam initiated at 4:38 PM.  Appropriate orders placed.  Carizma Dunsworth was informed that the remainder of the evaluation will be completed by another provider, this initial triage assessment does not replace that evaluation, and the importance of remaining in the ED until their evaluation is complete.  Workup initiated   [name], Olene Floss 01/31/22 1643

## 2022-02-01 ENCOUNTER — Other Ambulatory Visit: Payer: Self-pay

## 2022-02-01 ENCOUNTER — Encounter (HOSPITAL_COMMUNITY): Payer: Self-pay

## 2022-02-01 ENCOUNTER — Emergency Department (HOSPITAL_COMMUNITY)
Admission: EM | Admit: 2022-02-01 | Discharge: 2022-02-02 | Disposition: A | Discharge status: Home / Self Care | Payer: Medicaid Other | Attending: Emergency Medicine | Admitting: Emergency Medicine

## 2022-02-01 DIAGNOSIS — R21 Rash and other nonspecific skin eruption: Secondary | ICD-10-CM | POA: Insufficient documentation

## 2022-02-01 DIAGNOSIS — L304 Erythema intertrigo: Secondary | ICD-10-CM

## 2022-02-01 NOTE — ED Triage Notes (Signed)
Pt states that she has a rash on both legs 2 days ago. Pt states that it is red and burning.

## 2022-02-02 ENCOUNTER — Emergency Department (HOSPITAL_COMMUNITY)
Admission: EM | Admit: 2022-02-02 | Discharge: 2022-02-02 | Discharge status: Against Medical Advice | Payer: Medicaid Other

## 2022-02-02 MED ORDER — HYDROCORTISONE 1 % EX CREA
TOPICAL_CREAM | Freq: Two times a day (BID) | CUTANEOUS | Status: DC
  Filled 2022-02-02: qty 28

## 2022-02-02 NOTE — ED Provider Notes (Signed)
[facility]-EMERGENCY DEPT Provider Note   CSN: [id number] Arrival date & time: 02/01/22  1948     History  Chief Complaint  Patient presents with   Rash    Amanda Burnett is a 36 y.o. female with a past medical history of anxiety, adjustment disorder and paranoid schizophrenia presenting today with a rash.  She says she noticed that 3 days ago on her bilateral medial thighs.  Says it feels like it is "burning."   Rash      Home Medications Prior to Admission medications   Medication Sig Start Date End Date Taking? Authorizing Provider  ARIPiprazole ER (ABILIFY MAINTENA) 400 MG SRER injection Inject 2 mLs (400 mg total) into the muscle every 28 (twenty-eight) days. 06/19/18   Malvin Johns, MD  benztropine (COGENTIN) 0.5 MG tablet Take 1 tablet (0.5 mg total) by mouth 2 (two) times daily. 06/19/18   Malvin Johns, MD  cyclobenzaprine (FLEXERIL) 10 MG tablet Take 1 tablet (10 mg total) by mouth 2 (two) times daily as needed for muscle spasms. 01/20/22   Bethann Berkshire, MD  dicyclomine (BENTYL) 20 MG tablet Take 1 tablet (20 mg total) by mouth 2 (two) times daily. 12/31/21   Linwood Dibbles, MD  etodolac (LODINE) 300 MG capsule Take 1 capsule (300 mg total) by mouth every 8 (eight) hours as needed for pain 12/31/21   Linwood Dibbles, MD  ibuprofen (ADVIL) 800 MG tablet Take 1 tablet (800 mg total) by mouth 3 (three) times daily. 01/20/22   Bethann Berkshire, MD  risperiDONE (RISPERDAL) 3 MG tablet Take 1 tablet (3 mg total) by mouth 2 (two) times daily. 06/19/18   Malvin Johns, MD  SUMAtriptan (IMITREX) 50 MG tablet Take 1 tablet by mouth as needed for migraine. May repeat in 2 hours if headache persists or recurs. (Please do not take more than 2 tablets in 24 hours.). 01/13/22   Peter Garter, PA      Allergies    Patient has no known allergies.    Review of Systems   Review of Systems  Skin:  Positive for rash.    Physical Exam Updated Vital Signs BP 133/79   Pulse  (!) 104   Temp 98.5 F (36.9 C) (Oral)   Resp 14   LMP 01/25/2022 (Exact Date)   SpO2 100%  Physical Exam Vitals and nursing note reviewed.  Constitutional:      Appearance: Normal appearance.  HENT:     Head: Normocephalic and atraumatic.  Eyes:     General: No scleral icterus.    Conjunctiva/sclera: Conjunctivae normal.  Pulmonary:     Effort: Pulmonary effort is normal. No respiratory distress.  Skin:    Findings: No rash.     Comments: Very mild skin breakdown in patient's bilateral medial thighs.  Appears consistent with chafing  Neurological:     Mental Status: She is alert.  Psychiatric:        Mood and Affect: Mood normal.     ED Results / Procedures / Treatments   Labs (all labs ordered are listed, but only abnormal results are displayed) Labs Reviewed - No data to display  EKG None  Radiology No results found.  Procedures Procedures   Medications Ordered in ED Medications  hydrocortisone cream 1 % (has no administration in time range)    ED Course/ Medical Decision Making/ A&P  Medical Decision Making   36 year old female presenting today due to bilateral medial thigh discomfort.  She says that she noted it 3 days ago however she has a visit around a week ago for the same.  Of note patient has been to the emergency department every day of December.  Physical exam consistent with skin breakdown secondary to fraction.  I suspect patient has mental illness and homelessness resulting in walking long distances and chafing of her thighs.  She was educated on this and given instructions for hydrocortisone cream use.  Patient does not appear to have an emergent condition requiring further evaluation.  No signs of anaphylaxis, rash not consistent with shingles.  She has been in the department for 9 hours at this time without any changes in her presentation and I believe she is stable for discharge.  Final Clinical Impression(s) / ED  Diagnoses Final diagnoses:  None    Rx / DC Orders ED Discharge Orders     None      Results and diagnoses were explained to the patient. Return precautions discussed in full. Patient had no additional questions and expressed complete understanding.   This chart was dictated using voice recognition software.  Despite best efforts to proofread,  errors can occur which can change the documentation meaning.    Saddie Benders, PA-C 02/02/22 0504    Dione Booze, MD 02/02/22 [phone]

## 2022-02-02 NOTE — Discharge Instructions (Signed)
I believe you are having breakdown of your skin secondary to friction in your pants.  The colloquial term for this is chafing.  Use steroid cream such as hydrocortisone will be sure to keep the area clean and dry.  It was a pleasure to meet you and we hope you feel better!

## 2022-02-03 ENCOUNTER — Emergency Department (HOSPITAL_COMMUNITY)
Admission: EM | Admit: 2022-02-03 | Discharge: 2022-02-03 | Disposition: A | Discharge status: Home / Self Care | Payer: Medicaid Other | Attending: Emergency Medicine | Admitting: Emergency Medicine

## 2022-02-03 ENCOUNTER — Encounter (HOSPITAL_COMMUNITY): Payer: Self-pay

## 2022-02-03 ENCOUNTER — Emergency Department (HOSPITAL_COMMUNITY): Payer: Medicaid Other

## 2022-02-03 DIAGNOSIS — R1084 Generalized abdominal pain: Secondary | ICD-10-CM | POA: Diagnosis not present

## 2022-02-03 DIAGNOSIS — R1013 Epigastric pain: Secondary | ICD-10-CM | POA: Diagnosis present

## 2022-02-03 LAB — COMPREHENSIVE METABOLIC PANEL
ALT: 17 U/L (ref 0–44)
AST: 20 U/L (ref 15–41)
Albumin: 4.1 g/dL (ref 3.5–5.0)
Alkaline Phosphatase: 97 U/L (ref 38–126)
Anion gap: 5 (ref 5–15)
BUN: 11 mg/dL (ref 6–20)
CO2: 22 mmol/L (ref 22–32)
Calcium: 9.1 mg/dL (ref 8.9–10.3)
Chloride: 110 mmol/L (ref 98–111)
Creatinine, Ser: 0.83 mg/dL (ref 0.44–1.00)
GFR, Estimated: >60 mL/min (ref >60)
Glucose, Bld: 111 mg/dL — ABNORMAL HIGH (ref 70–99)
Potassium: 3.8 mmol/L (ref 3.5–5.1)
Sodium: 137 mmol/L (ref 135–145)
Total Bilirubin: 0.8 mg/dL (ref 0.3–1.2)
Total Protein: 7.7 g/dL (ref 6.5–8.1)

## 2022-02-03 LAB — CBC WITH DIFFERENTIAL/PLATELET
Abs Immature Granulocytes: 0.01 10*3/uL (ref 0.00–0.07)
Basophils Absolute: 0 10*3/uL (ref 0.0–0.1)
Basophils Relative: 1 %
Eosinophils Absolute: 0.1 10*3/uL (ref 0.0–0.5)
Eosinophils Relative: 2 %
HCT: 43.3 % (ref 36.0–46.0)
Hemoglobin: 14.1 g/dL (ref 12.0–15.0)
Immature Granulocytes: 0 %
Lymphocytes Relative: 37 %
Lymphs Abs: 1.5 10*3/uL (ref 0.7–4.0)
MCH: 31.2 pg (ref 26.0–34.0)
MCHC: 32.6 g/dL (ref 30.0–36.0)
MCV: 95.8 fL (ref 80.0–100.0)
Monocytes Absolute: 0.3 10*3/uL (ref 0.1–1.0)
Monocytes Relative: 9 %
Neutro Abs: 2 10*3/uL (ref 1.7–7.7)
Neutrophils Relative %: 51 %
Platelets: 307 10*3/uL (ref 150–400)
RBC: 4.52 MIL/uL (ref 3.87–5.11)
RDW: 13.3 % (ref 11.5–15.5)
WBC: 3.9 10*3/uL — ABNORMAL LOW (ref 4.0–10.5)
nRBC: 0 % (ref 0.0–0.2)

## 2022-02-03 LAB — URINALYSIS, ROUTINE W REFLEX MICROSCOPIC
Bilirubin Urine: NEGATIVE
Glucose, UA: NEGATIVE mg/dL
Hgb urine dipstick: NEGATIVE
Ketones, ur: NEGATIVE mg/dL
Leukocytes,Ua: NEGATIVE
Nitrite: NEGATIVE
Protein, ur: NEGATIVE mg/dL
Specific Gravity, Urine: 1.018 (ref 1.005–1.030)
pH: 5 (ref 5.0–8.0)

## 2022-02-03 LAB — LIPASE, BLOOD: Lipase: 37 U/L (ref 11–51)

## 2022-02-03 MED ORDER — IOHEXOL 300 MG/ML  SOLN
100.0000 mL | Freq: Once | INTRAMUSCULAR | Status: AC | PRN
  Administered 2022-02-03: 100 mL via INTRAVENOUS

## 2022-02-03 MED ORDER — ONDANSETRON HCL 4 MG/2ML IJ SOLN
4.0000 mg | Freq: Once | INTRAMUSCULAR | Status: AC
  Administered 2022-02-03: 4 mg via INTRAVENOUS
  Filled 2022-02-03: qty 2

## 2022-02-03 MED ORDER — FENTANYL CITRATE PF 50 MCG/ML IJ SOSY
50.0000 ug | PREFILLED_SYRINGE | Freq: Once | INTRAMUSCULAR | Status: AC
  Administered 2022-02-03: 50 ug via INTRAVENOUS
  Filled 2022-02-03: qty 1

## 2022-02-03 MED ORDER — FAMOTIDINE IN NACL 20-0.9 MG/50ML-% IV SOLN
20.0000 mg | Freq: Once | INTRAVENOUS | Status: AC
  Administered 2022-02-03: 20 mg via INTRAVENOUS
  Filled 2022-02-03: qty 50

## 2022-02-03 MED ORDER — PANTOPRAZOLE SODIUM 20 MG PO TBEC
20.0000 mg | DELAYED_RELEASE_TABLET | Freq: Every day | ORAL | 2 refills | Status: AC
  Filled 2022-02-03: qty 30, 30d supply, fill #0

## 2022-02-03 NOTE — ED Provider Triage Note (Signed)
Emergency Medicine Provider Triage Evaluation Note  Amanda Burnett , a 36 y.o. female  was evaluated in triage.  Pt complains of abdominal pain which started yesterday.  Reports mostly in the left upper quadrant.  Reports "I know they are tired of seeing me here, but I have so many complaints and no one is helping".  Denies any nausea or vomiting.  Last bowel movement was just 20 minutes ago.  Denies any dysuria.  Also complains of a rash to her groin which she was given a cream for but reports this is not improving.  History of homelessness per chart review and frequent ER visits  Review of Systems  Positive: As above of Negative: As above  Physical Exam  BP 124/83 (BP Location: Left Arm)   Pulse 88   Temp 98.4 F (36.9 C) (Oral)   Resp 17   LMP 01/25/2022 (Exact Date)   SpO2 100%  Gen:   Awake, no distress   Resp:  Normal effort  MSK:   Moves extremities without difficulty  Other:    Medical Decision Making  Medically screening exam initiated at 8:58 AM.  Appropriate orders placed.  Emmaleah Meroney was informed that the remainder of the evaluation will be completed by another provider, this initial triage assessment does not replace that evaluation, and the importance of remaining in the ED until their evaluation is complete.     Mare Ferrari, PA-C 02/03/22 [phone]

## 2022-02-03 NOTE — ED Triage Notes (Addendum)
Pt arrived via POV, c/o diffuse abd pain and nausea x3 hrs. Denies any vomiting or diarrhea.

## 2022-02-03 NOTE — ED Notes (Signed)
Pt ambulated from ED with with stable gat. Pt verbalized understanding of discharge instructions. Pt dressed for discharge.

## 2022-02-03 NOTE — ED Provider Notes (Signed)
[facility] DEPT Provider Note   CSN: [id number] Arrival date & time: 02/03/22  [id number]     History {Add pertinent medical, surgical, social history, OB history to HPI:1} Chief Complaint  Patient presents with   Abdominal Pain    Amanda Burnett is a 36 y.o. female.  She is here with complaint of severe upper abdominal pain and nausea that started yesterday.  She said she has had this before but nobody can ever give an answer what it is.  No fevers or chills.  No diarrhea or urinary symptoms.  No vaginal bleeding or discharge.  No prior surgical history.  She has ongoing stressors with family and is of frequent presented to the emergency department.  The history is provided by the patient.  Abdominal Pain Pain location:  Epigastric Pain quality: aching   Pain severity:  Severe Onset quality:  Gradual Duration:  2 days Timing:  Constant Progression:  Unchanged Chronicity:  Recurrent Context: not trauma   Relieved by:  None tried Worsened by:  Nothing Ineffective treatments:  None tried Associated symptoms: nausea   Associated symptoms: no chest pain, no constipation, no cough, no diarrhea, no dysuria, no fever, no hematemesis, no hematochezia, no hematuria, no shortness of breath, no vaginal bleeding, no vaginal discharge and no vomiting        Home Medications Prior to Admission medications   Medication Sig Start Date End Date Taking? Authorizing Provider  ARIPiprazole ER (ABILIFY MAINTENA) 400 MG SRER injection Inject 2 mLs (400 mg total) into the muscle every 28 (twenty-eight) days. 06/19/18   Johnn Hai, MD  benztropine (COGENTIN) 0.5 MG tablet Take 1 tablet (0.5 mg total) by mouth 2 (two) times daily. 06/19/18   Johnn Hai, MD  cyclobenzaprine (FLEXERIL) 10 MG tablet Take 1 tablet (10 mg total) by mouth 2 (two) times daily as needed for muscle spasms. 01/20/22   Milton Ferguson, MD  dicyclomine (BENTYL) 20 MG tablet Take 1 tablet (20 mg total)  by mouth 2 (two) times daily. 12/31/21   Dorie Rank, MD  etodolac (LODINE) 300 MG capsule Take 1 capsule (300 mg total) by mouth every 8 (eight) hours as needed for pain 12/31/21   Dorie Rank, MD  ibuprofen (ADVIL) 800 MG tablet Take 1 tablet (800 mg total) by mouth 3 (three) times daily. 01/20/22   Milton Ferguson, MD  risperiDONE (RISPERDAL) 3 MG tablet Take 1 tablet (3 mg total) by mouth 2 (two) times daily. 06/19/18   Johnn Hai, MD  SUMAtriptan (IMITREX) 50 MG tablet Take 1 tablet by mouth as needed for migraine. May repeat in 2 hours if headache persists or recurs. (Please do not take more than 2 tablets in 24 hours.). 01/13/22   Wilnette Kales, PA      Allergies    Patient has no known allergies.    Review of Systems   Review of Systems  Constitutional:  Negative for fever.  Respiratory:  Negative for cough and shortness of breath.   Cardiovascular:  Negative for chest pain.  Gastrointestinal:  Positive for abdominal pain and nausea. Negative for constipation, diarrhea, hematemesis, hematochezia and vomiting.  Genitourinary:  Negative for dysuria, hematuria, vaginal bleeding and vaginal discharge.    Physical Exam Updated Vital Signs BP 118/80   Pulse 77   Temp 98 F (36.7 C) (Oral)   Resp 16   LMP 01/25/2022 (Exact Date)   SpO2 94%  Physical Exam Vitals and nursing note reviewed.  Constitutional:  General: She is not in acute distress.    Appearance: Normal appearance. She is well-developed.  HENT:     Head: Normocephalic and atraumatic.  Eyes:     Conjunctiva/sclera: Conjunctivae normal.  Cardiovascular:     Rate and Rhythm: Normal rate and regular rhythm.     Heart sounds: No murmur heard. Pulmonary:     Effort: Pulmonary effort is normal. No respiratory distress.     Breath sounds: Normal breath sounds.  Abdominal:     Palpations: Abdomen is soft.     Tenderness: There is no abdominal tenderness. There is no guarding or rebound.  Musculoskeletal:         General: No swelling.     Cervical back: Neck supple.  Skin:    General: Skin is warm and dry.     Capillary Refill: Capillary refill takes less than 2 seconds.  Neurological:     General: No focal deficit present.     Mental Status: She is alert.     ED Results / Procedures / Treatments   Labs (all labs ordered are listed, but only abnormal results are displayed) Labs Reviewed  CBC WITH DIFFERENTIAL/PLATELET - Abnormal; Notable for the following components:      Result Value   WBC 3.9 (*)    All other components within normal limits  COMPREHENSIVE METABOLIC PANEL - Abnormal; Notable for the following components:   Glucose, Bld 111 (*)    All other components within normal limits  LIPASE, BLOOD  URINALYSIS, ROUTINE W REFLEX MICROSCOPIC  I-STAT BETA HCG BLOOD, ED ([facility], [facility], [facility] ONLY)    EKG None  Radiology No results found.  Procedures Procedures  {Document cardiac monitor, telemetry assessment procedure when appropriate:1}  Medications Ordered in ED Medications  ondansetron (ZOFRAN) injection 4 mg (has no administration in time range)  fentaNYL (SUBLIMAZE) injection 50 mcg (has no administration in time range)  famotidine (PEPCID) IVPB 20 mg premix (has no administration in time range)    ED Course/ Medical Decision Making/ A&P                           Medical Decision Making Amount and/or Complexity of Data Reviewed Radiology: ordered.  Risk Prescription drug management.   This patient complains of ***; this involves an extensive number of treatment Options and is a complaint that carries with it a high risk of complications and morbidity. The differential includes ***  I ordered, reviewed and interpreted labs, which included *** I ordered medication *** and reviewed PMP when indicated. I ordered imaging studies which included *** and I independently    visualized and interpreted imaging which showed *** Additional history obtained from *** Previous  records obtained and reviewed *** I consulted *** and discussed lab and imaging findings and discussed disposition.  Cardiac monitoring reviewed, *** Social determinants considered, *** Critical Interventions: ***  After the interventions stated above, I reevaluated the patient and found *** Admission and further testing considered, ***   {Document critical care time when appropriate:1} {Document review of labs and clinical decision tools ie heart score, Chads2Vasc2 etc:1}  {Document your independent review of radiology images, and any outside records:1} {Document your discussion with family members, caretakers, and with consultants:1} {Document social determinants of health affecting pt's care:1} {Document your decision making why or why not admission, treatments were needed:1} Final Clinical Impression(s) / ED Diagnoses Final diagnoses:  None    Rx / DC Orders ED Discharge  Orders     None

## 2022-02-03 NOTE — ED Notes (Signed)
Pt is refusing I-stat blood draw for HCG. Dr. Charm Barges informed. Discussed with MD consent form in lieu of labs. MD agreeable. Pt will be educated and consent sign will be in chart if pt is agreeable to scan.

## 2022-02-03 NOTE — Discharge Instructions (Signed)
You are seen in the emergency department for abdominal pain.  You had blood work urinalysis and a CAT scan that did not show an obvious explanation for your symptoms.  We are starting you on some acid medication.  Please start with a clear liquid diet advance as tolerated.  Return to the emergency department if any worsening or concerning symptoms

## 2022-02-03 NOTE — ED Notes (Signed)
Patient refuses I-stat. She states "I'm not sexually active."

## 2022-02-04 ENCOUNTER — Other Ambulatory Visit (HOSPITAL_COMMUNITY): Payer: Self-pay

## 2022-02-04 ENCOUNTER — Emergency Department (HOSPITAL_COMMUNITY)
Admission: EM | Admit: 2022-02-04 | Discharge: 2022-02-04 | Discharge status: Against Medical Advice | Payer: Medicaid Other

## 2022-02-04 DIAGNOSIS — R143 Flatulence: Secondary | ICD-10-CM | POA: Insufficient documentation

## 2022-02-04 DIAGNOSIS — R1084 Generalized abdominal pain: Secondary | ICD-10-CM | POA: Insufficient documentation

## 2022-02-04 DIAGNOSIS — R1012 Left upper quadrant pain: Secondary | ICD-10-CM | POA: Diagnosis not present

## 2022-02-04 DIAGNOSIS — R1032 Left lower quadrant pain: Secondary | ICD-10-CM | POA: Diagnosis not present

## 2022-02-05 ENCOUNTER — Other Ambulatory Visit: Payer: Self-pay

## 2022-02-05 ENCOUNTER — Encounter (HOSPITAL_COMMUNITY): Payer: Self-pay

## 2022-02-05 ENCOUNTER — Emergency Department (HOSPITAL_COMMUNITY)
Admission: EM | Admit: 2022-02-05 | Discharge: 2022-02-05 | Disposition: A | Discharge status: Home / Self Care | Payer: Medicaid Other | Attending: Emergency Medicine | Admitting: Emergency Medicine

## 2022-02-05 ENCOUNTER — Emergency Department (HOSPITAL_COMMUNITY)
Admission: EM | Admit: 2022-02-05 | Discharge: 2022-02-06 | Disposition: A | Discharge status: Home / Self Care | Payer: Medicaid Other | Source: Home / Self Care | Attending: Emergency Medicine | Admitting: Emergency Medicine

## 2022-02-05 DIAGNOSIS — R1032 Left lower quadrant pain: Secondary | ICD-10-CM | POA: Insufficient documentation

## 2022-02-05 DIAGNOSIS — R1084 Generalized abdominal pain: Secondary | ICD-10-CM

## 2022-02-05 DIAGNOSIS — R140 Abdominal distension (gaseous): Secondary | ICD-10-CM

## 2022-02-05 DIAGNOSIS — R1012 Left upper quadrant pain: Secondary | ICD-10-CM | POA: Insufficient documentation

## 2022-02-05 NOTE — ED Provider Triage Note (Signed)
Emergency Medicine Provider Triage Evaluation Note  Amanda Burnett , a 36 y.o. female  was evaluated in triage.  Pt complains of left abdominal pain for 2 days.  Patient has been evaluated twice in the last 2 days for same reason.  Patient reports she was given fentanyl that made her nauseous and did not help with the pain.  Patient reports the pain is located on the left side of the abdomen radiating to her L chest.  No fever, nausea, vomiting, chest pain, shortness of breath.  Last bowel movement yesterday.  Review of Systems  Positive: As above Negative: As above  Physical Exam  BP 117/87 (BP Location: Left Arm)   Pulse 84   Temp 97.9 F (36.6 C) (Oral)   Resp 16   Ht 5\' 6"  (1.676 m)   Wt 73 kg   LMP 01/25/2022 (Exact Date)   SpO2 100%   BMI 25.99 kg/m  Gen:   Awake, no distress   Resp:  Normal effort  MSK:   Moves extremities without difficulty  Other:  Tenderness to palpation to left lower quadrant and left upper quadrant.  Medical Decision Making  Medically screening exam initiated at 9:14 PM.  Appropriate orders placed.  Airyonna Franklyn was informed that the remainder of the evaluation will be completed by another provider, this initial triage assessment does not replace that evaluation, and the importance of remaining in the ED until their evaluation is complete.     [name], Jeanelle Malling 02/05/22 2117

## 2022-02-05 NOTE — ED Provider Notes (Signed)
[facility]-EMERGENCY DEPT Provider Note   CSN: [id number] Arrival date & time: 02/04/22  2358     History  Chief Complaint  Patient presents with   Gas    Amanda Burnett is a 36 y.o. female with history of anxiety, paranoid schizophrenia.  Patient presents to ED for evaluation of gassiness.  Patient reports that ever since being discharged yesterday she has had increased feelings of bloating.  Patient reports she was seen yesterday for abdominal pain and had a reassuring workup.  Patient reports she was placed on Protonix at discharge which she has been taking ever since yesterday.  The patient reports that her abdominal pain has persisted since being discharged.  Patient states abdominal pain is generalized in nature, nonfocal.  Patient denies any fevers, nausea, vomiting, diarrhea, dysuria, flank pain.  Patient denies lightheadedness, dizziness, weakness.  Of note, this is the patient's 34th visit to the ED in the last 6 months.  HPI     Home Medications Prior to Admission medications   Medication Sig Start Date End Date Taking? Authorizing Provider  ARIPiprazole ER (ABILIFY MAINTENA) 400 MG SRER injection Inject 2 mLs (400 mg total) into the muscle every 28 (twenty-eight) days. 06/19/18   Malvin Johns, MD  benztropine (COGENTIN) 0.5 MG tablet Take 1 tablet (0.5 mg total) by mouth 2 (two) times daily. 06/19/18   Malvin Johns, MD  cyclobenzaprine (FLEXERIL) 10 MG tablet Take 1 tablet (10 mg total) by mouth 2 (two) times daily as needed for muscle spasms. 01/20/22   Bethann Berkshire, MD  dicyclomine (BENTYL) 20 MG tablet Take 1 tablet (20 mg total) by mouth 2 (two) times daily. 12/31/21   Linwood Dibbles, MD  etodolac (LODINE) 300 MG capsule Take 1 capsule (300 mg total) by mouth every 8 (eight) hours as needed for pain 12/31/21   Linwood Dibbles, MD  ibuprofen (ADVIL) 800 MG tablet Take 1 tablet (800 mg total) by mouth 3 (three) times daily. 01/20/22   Bethann Berkshire, MD   pantoprazole (PROTONIX) 20 MG tablet Take 1 tablet (20 mg total) by mouth daily. 02/03/22   Terrilee Files, MD  risperiDONE (RISPERDAL) 3 MG tablet Take 1 tablet (3 mg total) by mouth 2 (two) times daily. 06/19/18   Malvin Johns, MD  SUMAtriptan (IMITREX) 50 MG tablet Take 1 tablet by mouth as needed for migraine. May repeat in 2 hours if headache persists or recurs. (Please do not take more than 2 tablets in 24 hours.). 01/13/22   Peter Garter, PA      Allergies    Patient has no known allergies.    Review of Systems   Review of Systems  Constitutional:  Negative for fever.  Gastrointestinal:  Positive for abdominal distention and abdominal pain. Negative for diarrhea, nausea and vomiting.  Genitourinary:  Negative for dysuria and flank pain.  Neurological:  Negative for dizziness, weakness and light-headedness.  All other systems reviewed and are negative.   Physical Exam Updated Vital Signs BP 107/80 (BP Location: Left Arm)   Pulse 85   Temp (!) 97.4 F (36.3 C) (Oral)   Resp 17   Ht 5\' 6"  (1.676 m)   Wt 73 kg   LMP 01/25/2022 (Exact Date)   SpO2 100%   BMI 25.99 kg/m  Physical Exam Vitals and nursing note reviewed.  Constitutional:      General: She is not in acute distress.    Appearance: Normal appearance. She is not ill-appearing, toxic-appearing or diaphoretic.  HENT:     Head: Normocephalic and atraumatic.     Nose: Nose normal. No congestion.     Mouth/Throat:     Mouth: Mucous membranes are moist.     Pharynx: Oropharynx is clear.  Eyes:     Extraocular Movements: Extraocular movements intact.     Conjunctiva/sclera: Conjunctivae normal.     Pupils: Pupils are equal, round, and reactive to light.  Cardiovascular:     Rate and Rhythm: Normal rate and regular rhythm.  Pulmonary:     Effort: Pulmonary effort is normal.     Breath sounds: Normal breath sounds. No wheezing.  Abdominal:     General: Abdomen is flat. Bowel sounds are normal. There is no  distension.     Palpations: Abdomen is soft.     Tenderness: There is no abdominal tenderness. There is no guarding.     Comments: No tenderness to palpation of abdomen. No rebound, guarding. Patient abdomen soft and compressible, benign.   Musculoskeletal:     Cervical back: Normal range of motion and neck supple. No tenderness.  Skin:    General: Skin is warm and dry.     Capillary Refill: Capillary refill takes less than 2 seconds.  Neurological:     Mental Status: She is alert and oriented to person, place, and time.     ED Results / Procedures / Treatments   Labs (all labs ordered are listed, but only abnormal results are displayed) Labs Reviewed - No data to display  EKG None  Radiology CT Abdomen Pelvis W Contrast  Result Date: 02/03/2022 CLINICAL DATA:  Acute abdominal pain. EXAM: CT ABDOMEN AND PELVIS WITH CONTRAST TECHNIQUE: Multidetector CT imaging of the abdomen and pelvis was performed using the standard protocol following bolus administration of intravenous contrast. RADIATION DOSE REDUCTION: This exam was performed according to the departmental dose-optimization program which includes automated exposure control, adjustment of the mA and/or kV according to patient size and/or use of iterative reconstruction technique. CONTRAST:  OMNIPAQUE IOHEXOL 300 MG/ML  SOLN COMPARISON:  None Available. FINDINGS: Lower chest: No acute abnormality. Hepatobiliary: No focal liver abnormality is seen. No gallstones, gallbladder wall thickening, or biliary dilatation. Pancreas: Unremarkable. No pancreatic ductal dilatation or surrounding inflammatory changes. Spleen: Normal in size without focal abnormality. Adrenals/Urinary Tract: Adrenal glands are unremarkable. Kidneys are normal, without renal calculi, focal lesion, or hydronephrosis. Bladder is unremarkable. Stomach/Bowel: Stomach is within normal limits. Appendix appears normal. No evidence of bowel wall thickening, distention, or  inflammatory changes. Vascular/Lymphatic: No significant vascular findings are present. No enlarged abdominal or pelvic lymph nodes. Reproductive: Uterus and bilateral adnexa are unremarkable. Other: No abdominal wall hernia or abnormality. No abdominopelvic ascites. Musculoskeletal: No acute or significant osseous findings. IMPRESSION: No CT evidence of acute abdominal/pelvic process. Electronically Signed   By: Darliss Cheney M.D.   On: 02/03/2022 20:04    Procedures Procedures   Medications Ordered in ED Medications - No data to display  ED Course/ Medical Decision Making/ A&P                           Medical Decision Making  36 year old female presents to the ED for evaluation of gassiness and abdominal pain.  Please see HPI for further details.  On examination the patient is afebrile and nontachycardic.  Patient lung sounds are clear bilaterally, she is not hypoxic.  The patient abdomen is soft and compressible throughout without findings of rebound, guarding.  Patient has no tenderness on examination, completely benign abdominal exam.  Patient nontoxic in appearance.  Patient vital signs reassuring.  Patient was seen here yesterday and had an extensive workup to include CBC, CMP, lipase, urinalysis, CT abdomen pelvis with contrast.  Patient workup was reassuring and the patient was discharged home with Protonix and advised to follow-up with her PCP.  Patient returns today stating that her abdominal pain is similar to yesterday, no new features.  Due to this, we will not reimage.  Patient denies any nausea, vomiting or diarrhea so I have low suspicion for electrolyte derangement.  Patient returns today complaining of feelings of gassiness after taking initial dose of Protonix.  The patient denies any nausea, vomiting, diarrhea or fevers.  Patient denies any dysuria or flank pain.  The patient vital signs are reassuring.  Patient will be instructed to begin taking Gas-X for feelings of  bloating and follow-up with PCP which I will refer her to as previously advised.  The patient was given return precautions and she voiced understanding.  The patient and all of her questions answered to her satisfaction.  The patient is stable for discharge.  Final Clinical Impression(s) / ED Diagnoses Final diagnoses:  Gassiness    Rx / DC Orders ED Discharge Orders     None         Al Decant, PA-C 02/05/22 0449    Sabas Sous, MD 02/05/22 [phone]

## 2022-02-05 NOTE — ED Triage Notes (Signed)
Seen yesterday for generalized abdominal pain and was started on Protonix.   Sts she took first dose and felt "gassey".

## 2022-02-05 NOTE — ED Triage Notes (Signed)
Patient coming to ED for evaluation of L sided abdominal pain.  Reports symptoms started several days ago without improvement.  Has been seen in ED multiple times for the same.  Given Tylenol and "acid medication."  States pain is severe and medications are not working.

## 2022-02-05 NOTE — Discharge Instructions (Signed)
Return to ED with any new or worsening signs or symptoms Please been taking Gas-X.  Please follow instructions on box. Please read attached guide for abdominal bloating Please follow-up with PCP.  I referred her to 1.  Please call make an appoint to be seen on Monday.

## 2022-02-06 ENCOUNTER — Encounter (HOSPITAL_COMMUNITY): Payer: Self-pay

## 2022-02-06 ENCOUNTER — Other Ambulatory Visit: Payer: Self-pay

## 2022-02-06 ENCOUNTER — Emergency Department (HOSPITAL_COMMUNITY)
Admission: EM | Admit: 2022-02-06 | Discharge: 2022-02-07 | Disposition: A | Discharge status: Home / Self Care | Payer: Medicaid Other | Attending: Emergency Medicine | Admitting: Emergency Medicine

## 2022-02-06 ENCOUNTER — Emergency Department (HOSPITAL_COMMUNITY): Payer: Medicaid Other

## 2022-02-06 DIAGNOSIS — R1084 Generalized abdominal pain: Secondary | ICD-10-CM | POA: Insufficient documentation

## 2022-02-06 DIAGNOSIS — R0789 Other chest pain: Secondary | ICD-10-CM | POA: Diagnosis not present

## 2022-02-06 DIAGNOSIS — R079 Chest pain, unspecified: Secondary | ICD-10-CM

## 2022-02-06 DIAGNOSIS — R1032 Left lower quadrant pain: Secondary | ICD-10-CM | POA: Diagnosis present

## 2022-02-06 MED ORDER — KETOROLAC TROMETHAMINE 15 MG/ML IJ SOLN
15.0000 mg | Freq: Once | INTRAMUSCULAR | Status: DC
  Filled 2022-02-06: qty 1

## 2022-02-06 MED ORDER — ACETAMINOPHEN 500 MG PO TABS
1000.0000 mg | ORAL_TABLET | Freq: Once | ORAL | Status: DC
  Filled 2022-02-06: qty 2

## 2022-02-06 MED ORDER — OXYCODONE-ACETAMINOPHEN 5-325 MG PO TABS
1.0000 | ORAL_TABLET | Freq: Once | ORAL | Status: AC
  Administered 2022-02-06: 1 via ORAL
  Filled 2022-02-06: qty 1

## 2022-02-06 NOTE — Discharge Instructions (Signed)
Please continue acid medication.  We have put in a consult to social work to try to get you set up with a primary care doctor.

## 2022-02-06 NOTE — ED Notes (Signed)
Saltines and ginger ale provided when meds given. Immediately requested a [place] sandwich which was provided.

## 2022-02-06 NOTE — ED Triage Notes (Signed)
Continues to  have ongoing abdominal pain

## 2022-02-06 NOTE — Discharge Instructions (Signed)
Take 4 over the counter ibuprofen tablets 3 times a day or 2 over-the-counter naproxen tablets twice a day for pain. Also take tylenol 1000mg(2 extra strength) four times a day.    

## 2022-02-06 NOTE — ED Provider Notes (Signed)
[facility]-EMERGENCY DEPT Provider Note   CSN: [id number] Arrival date & time: 02/05/22  2040     History  Chief Complaint  Patient presents with   Abdominal Pain    Amanda Burnett is a 36 y.o. female.  She is here with complaint of left-sided abdominal pain.  She said it started a few days ago.  I actually saw her a few days ago when she had negative labs negative CT.  She was given Protonix which did not seem to help.  She has been in the ED once since then and told to use some Gas-X.  She said she last ate last night.  No nausea vomiting diarrhea or urinary symptoms.  No fevers or chills.  The history is provided by the patient.  Abdominal Pain Pain location:  LUQ and LLQ Pain radiates to:  Chest Pain severity:  Moderate Onset quality:  Gradual Duration:  4 days Timing:  Constant Progression:  Unchanged Chronicity:  New Context: not trauma   Relieved by:  Nothing Worsened by:  Nothing Ineffective treatments: protonix. Associated symptoms: no constipation, no cough, no diarrhea, no dysuria, no fever, no nausea and no vomiting        Home Medications Prior to Admission medications   Medication Sig Start Date End Date Taking? Authorizing Provider  ARIPiprazole ER (ABILIFY MAINTENA) 400 MG SRER injection Inject 2 mLs (400 mg total) into the muscle every 28 (twenty-eight) days. 06/19/18   Malvin Johns, MD  benztropine (COGENTIN) 0.5 MG tablet Take 1 tablet (0.5 mg total) by mouth 2 (two) times daily. 06/19/18   Malvin Johns, MD  cyclobenzaprine (FLEXERIL) 10 MG tablet Take 1 tablet (10 mg total) by mouth 2 (two) times daily as needed for muscle spasms. 01/20/22   Bethann Berkshire, MD  dicyclomine (BENTYL) 20 MG tablet Take 1 tablet (20 mg total) by mouth 2 (two) times daily. 12/31/21   Linwood Dibbles, MD  etodolac (LODINE) 300 MG capsule Take 1 capsule (300 mg total) by mouth every 8 (eight) hours as needed for pain 12/31/21   Linwood Dibbles, MD  ibuprofen (ADVIL)  800 MG tablet Take 1 tablet (800 mg total) by mouth 3 (three) times daily. 01/20/22   Bethann Berkshire, MD  pantoprazole (PROTONIX) 20 MG tablet Take 1 tablet (20 mg total) by mouth daily. 02/03/22   Terrilee Files, MD  risperiDONE (RISPERDAL) 3 MG tablet Take 1 tablet (3 mg total) by mouth 2 (two) times daily. 06/19/18   Malvin Johns, MD  SUMAtriptan (IMITREX) 50 MG tablet Take 1 tablet by mouth as needed for migraine. May repeat in 2 hours if headache persists or recurs. (Please do not take more than 2 tablets in 24 hours.). 01/13/22   Peter Garter, PA      Allergies    Patient has no known allergies.    Review of Systems   Review of Systems  Constitutional:  Negative for fever.  Respiratory:  Negative for cough.   Gastrointestinal:  Positive for abdominal pain. Negative for constipation, diarrhea, nausea and vomiting.  Genitourinary:  Negative for dysuria.    Physical Exam Updated Vital Signs BP 117/87 (BP Location: Left Arm)   Pulse 84   Temp 97.9 F (36.6 C) (Oral)   Resp 16   Ht 5\' 6"  (1.676 m)   Wt 73 kg   LMP 01/25/2022 (Exact Date)   SpO2 100%   BMI 25.99 kg/m  Physical Exam Vitals and nursing note reviewed.  Constitutional:  General: She is not in acute distress.    Appearance: Normal appearance. She is well-developed.  HENT:     Head: Normocephalic and atraumatic.  Eyes:     Conjunctiva/sclera: Conjunctivae normal.  Cardiovascular:     Rate and Rhythm: Normal rate and regular rhythm.     Heart sounds: No murmur heard. Pulmonary:     Effort: Pulmonary effort is normal. No respiratory distress.     Breath sounds: Normal breath sounds.  Abdominal:     Palpations: Abdomen is soft.     Tenderness: There is no abdominal tenderness. There is no guarding or rebound.  Musculoskeletal:        General: No swelling.     Cervical back: Neck supple.  Skin:    General: Skin is warm and dry.     Capillary Refill: Capillary refill takes less than 2 seconds.   Neurological:     General: No focal deficit present.     Mental Status: She is alert.     ED Results / Procedures / Treatments   Labs (all labs ordered are listed, but only abnormal results are displayed) Labs Reviewed - No data to display  EKG None  Radiology No results found.  Procedures Procedures    Medications Ordered in ED Medications  oxyCODONE-acetaminophen (PERCOCET/ROXICET) 5-325 MG per tablet 1 tablet (has no administration in time range)    ED Course/ Medical Decision Making/ A&P Clinical Course as of 02/06/22 1816  Sat Feb 06, 2022  0927 Patient had 0 pain after Percocet, ate sandwich.  Recommended continuing acid medication.  Patient will need PCP.  Place consult to social work to see if they can help her arrange this. [MB]    Clinical Course User Index [MB] Terrilee Files, MD                           Medical Decision Making Risk Prescription drug management.   This patient complains of abdominal pain; this involves an extensive number of treatment Options and is a complaint that carries with it a high risk of complications and morbidity. The differential includes gastritis, peptic ulcer disease, UTI, appendicitis, diverticulitis, colitis  I ordered medication oral pain medication and reviewed PMP when indicated. Previous records obtained and reviewed in epic including multiple recent ED visits for similar presentation Social determinants considered, to have significant barriers Critical Interventions: None  After the interventions stated above, I reevaluated the patient and found patient to have a benign exam and no distress Admission and further testing considered, no indications for admission or further workup at this time.  Have placed a call for social work to see if they can get her set up with a primary care doctor.  Return instructions discussed         Final Clinical Impression(s) / ED Diagnoses Final diagnoses:  Generalized  abdominal pain    Rx / DC Orders ED Discharge Orders     None         Terrilee Files, MD 02/06/22 1818

## 2022-02-06 NOTE — ED Provider Notes (Signed)
[facility]-EMERGENCY DEPT Provider Note   CSN: [id number] Arrival date & time: 02/06/22  2009     History  Chief Complaint  Patient presents with   Abdominal Pain    Amanda Burnett is a 36 y.o. female.  36 yo F with a cc of left flank pain.  Going on for the past few days.  Just feels like a pain.  Nothing seems to make it better or worse.  Denies fevers, urinary symptoms.  Denies rash.  Denies trauma.  Tylenol doesn't seem to make it worse.    Abdominal Pain      Home Medications Prior to Admission medications   Medication Sig Start Date End Date Taking? Authorizing Provider  ARIPiprazole ER (ABILIFY MAINTENA) 400 MG SRER injection Inject 2 mLs (400 mg total) into the muscle every 28 (twenty-eight) days. 06/19/18   Malvin Johns, MD  benztropine (COGENTIN) 0.5 MG tablet Take 1 tablet (0.5 mg total) by mouth 2 (two) times daily. 06/19/18   Malvin Johns, MD  cyclobenzaprine (FLEXERIL) 10 MG tablet Take 1 tablet (10 mg total) by mouth 2 (two) times daily as needed for muscle spasms. 01/20/22   Bethann Berkshire, MD  dicyclomine (BENTYL) 20 MG tablet Take 1 tablet (20 mg total) by mouth 2 (two) times daily. 12/31/21   Linwood Dibbles, MD  etodolac (LODINE) 300 MG capsule Take 1 capsule (300 mg total) by mouth every 8 (eight) hours as needed for pain 12/31/21   Linwood Dibbles, MD  ibuprofen (ADVIL) 800 MG tablet Take 1 tablet (800 mg total) by mouth 3 (three) times daily. 01/20/22   Bethann Berkshire, MD  pantoprazole (PROTONIX) 20 MG tablet Take 1 tablet (20 mg total) by mouth daily. 02/03/22   Terrilee Files, MD  risperiDONE (RISPERDAL) 3 MG tablet Take 1 tablet (3 mg total) by mouth 2 (two) times daily. 06/19/18   Malvin Johns, MD  SUMAtriptan (IMITREX) 50 MG tablet Take 1 tablet by mouth as needed for migraine. May repeat in 2 hours if headache persists or recurs. (Please do not take more than 2 tablets in 24 hours.). 01/13/22   Peter Garter, PA      Allergies     Patient has no known allergies.    Review of Systems   Review of Systems  Gastrointestinal:  Positive for abdominal pain.    Physical Exam Updated Vital Signs BP 122/78 (BP Location: Left Arm)   Pulse 77   Temp (!) 97.5 F (36.4 C) (Oral)   Resp 18   LMP 01/25/2022 (Exact Date)   SpO2 100%  Physical Exam Vitals and nursing note reviewed.  Constitutional:      General: She is not in acute distress.    Appearance: She is well-developed. She is not diaphoretic.  HENT:     Head: Normocephalic and atraumatic.  Eyes:     Pupils: Pupils are equal, round, and reactive to light.  Cardiovascular:     Rate and Rhythm: Normal rate and regular rhythm.     Heart sounds: No murmur heard.    No friction rub. No gallop.  Pulmonary:     Effort: Pulmonary effort is normal.     Breath sounds: No wheezing or rales.  Chest:    Abdominal:     General: There is no distension.     Palpations: Abdomen is soft.     Tenderness: There is no abdominal tenderness.  Musculoskeletal:        General: No tenderness.  Cervical back: Normal range of motion and neck supple.  Skin:    General: Skin is warm and dry.  Neurological:     Mental Status: She is alert and oriented to person, place, and time.  Psychiatric:        Behavior: Behavior normal.     ED Results / Procedures / Treatments   Labs (all labs ordered are listed, but only abnormal results are displayed) Labs Reviewed - No data to display  EKG None  Radiology No results found.  Procedures Procedures    Medications Ordered in ED Medications  ketorolac (TORADOL) 15 MG/ML injection 15 mg (has no administration in time range)  acetaminophen (TYLENOL) tablet 1,000 mg (has no administration in time range)    ED Course/ Medical Decision Making/ A&P                           Medical Decision Making Amount and/or Complexity of Data Reviewed Radiology: ordered.  Risk OTC drugs. Prescription drug management.   36 yo  F With a chief complaint of flank pain.  This has been going on for a few days.  Most likely musculoskeletal by history and physical.  Reproduced on exam.  No rash.  No intra-abdominal tenderness.  The patient has had 36 ED visits in the past 6 months.  Will obtain a screening x-ray as she has not had an x-ray of the chest.  Chest x-ray unchanged from prior on my independent interpretation.  Will discharge home.  PCP follow-up. 10:10 PM:  I have discussed the diagnosis/risks/treatment options with the patient.  Evaluation and diagnostic testing in the emergency department does not suggest an emergent condition requiring admission or immediate intervention beyond what has been performed at this time.  They will follow up with PCP. We also discussed returning to the ED immediately if new or worsening sx occur. We discussed the sx which are most concerning (e.g., sudden worsening pain, fever, inability to tolerate by mouth) that necessitate immediate return. Medications administered to the patient during their visit and any new prescriptions provided to the patient are listed below.  Medications given during this visit Medications  ketorolac (TORADOL) 15 MG/ML injection 15 mg (has no administration in time range)  acetaminophen (TYLENOL) tablet 1,000 mg (has no administration in time range)     The patient appears reasonably screen and/or stabilized for discharge and I doubt any other medical condition or other [facility] requiring further screening, evaluation, or treatment in the ED at this time prior to discharge.           Final Clinical Impression(s) / ED Diagnoses Final diagnoses:  Left-sided chest pain    Rx / DC Orders ED Discharge Orders     None         Deno Etienne, DO 02/06/22 2210

## 2022-02-07 ENCOUNTER — Emergency Department (HOSPITAL_COMMUNITY)
Admission: EM | Admit: 2022-02-07 | Discharge: 2022-02-07 | Disposition: A | Discharge status: Home / Self Care | Payer: Medicaid Other | Source: Home / Self Care | Attending: Emergency Medicine | Admitting: Emergency Medicine

## 2022-02-07 ENCOUNTER — Encounter (HOSPITAL_COMMUNITY): Payer: Self-pay

## 2022-02-07 DIAGNOSIS — R1084 Generalized abdominal pain: Secondary | ICD-10-CM | POA: Insufficient documentation

## 2022-02-07 LAB — URINALYSIS, ROUTINE W REFLEX MICROSCOPIC
Bilirubin Urine: NEGATIVE
Glucose, UA: NEGATIVE mg/dL
Hgb urine dipstick: NEGATIVE
Ketones, ur: NEGATIVE mg/dL
Leukocytes,Ua: NEGATIVE
Nitrite: NEGATIVE
Protein, ur: NEGATIVE mg/dL
Specific Gravity, Urine: 1.024 (ref 1.005–1.030)
pH: 5 (ref 5.0–8.0)

## 2022-02-07 LAB — PREGNANCY, URINE: Preg Test, Ur: NEGATIVE

## 2022-02-07 MED ORDER — OXYCODONE-ACETAMINOPHEN 5-325 MG PO TABS
1.0000 | ORAL_TABLET | Freq: Once | ORAL | Status: AC
  Administered 2022-02-07: 1 via ORAL
  Filled 2022-02-07: qty 1

## 2022-02-07 MED ORDER — DICYCLOMINE HCL 10 MG PO CAPS
10.0000 mg | ORAL_CAPSULE | Freq: Once | ORAL | Status: AC
  Administered 2022-02-07: 10 mg via ORAL
  Filled 2022-02-07: qty 1

## 2022-02-07 NOTE — Discharge Instructions (Signed)
Use Tylenol or Motrin as needed for aches and pains.  You may use Bentyl as needed for gas and abdominal cramps.  Return to the ED with new or worsening symptoms

## 2022-02-07 NOTE — ED Triage Notes (Signed)
Pt states abd pain in LUQ radiating up to breast. No relief with tylenol, acid reducer, cough syrup. Pt states pain is only relieved with percocet

## 2022-02-07 NOTE — ED Provider Notes (Signed)
[facility]-EMERGENCY DEPT Provider Note   CSN: [id number] Arrival date & time: 02/07/22  1836     History  Chief Complaint  Patient presents with   Abdominal Pain    Amanda Burnett is a 36 y.o. female.  Left-sided abdominal pain since yesterday.  Multiple recent visits for same.  Reports the pain is to her left abdomen and not at the same location as her ribs where she was seen yesterday.  Has been constipated for several days.  No fever, chills, nausea or vomiting.  Still eating and drinking well.  No pain with urination or blood in the urine.  No vaginal bleeding or discharge.  Denies possibility of pregnancy.  Had a CT scan 4 days ago that was reassuring.  States pain improved with Percocet but returned.  The history is provided by the patient.  Abdominal Pain Associated symptoms: constipation   Associated symptoms: no chest pain, no cough, no dysuria, no fever, no hematuria, no nausea, no shortness of breath and no vomiting        Home Medications Prior to Admission medications   Medication Sig Start Date End Date Taking? Authorizing Provider  ARIPiprazole ER (ABILIFY MAINTENA) 400 MG SRER injection Inject 2 mLs (400 mg total) into the muscle every 28 (twenty-eight) days. 06/19/18   Malvin Johns, MD  benztropine (COGENTIN) 0.5 MG tablet Take 1 tablet (0.5 mg total) by mouth 2 (two) times daily. 06/19/18   Malvin Johns, MD  cyclobenzaprine (FLEXERIL) 10 MG tablet Take 1 tablet (10 mg total) by mouth 2 (two) times daily as needed for muscle spasms. 01/20/22   Bethann Berkshire, MD  dicyclomine (BENTYL) 20 MG tablet Take 1 tablet (20 mg total) by mouth 2 (two) times daily. 12/31/21   Linwood Dibbles, MD  etodolac (LODINE) 300 MG capsule Take 1 capsule (300 mg total) by mouth every 8 (eight) hours as needed for pain 12/31/21   Linwood Dibbles, MD  ibuprofen (ADVIL) 800 MG tablet Take 1 tablet (800 mg total) by mouth 3 (three) times daily. 01/20/22   Bethann Berkshire, MD   pantoprazole (PROTONIX) 20 MG tablet Take 1 tablet (20 mg total) by mouth daily. 02/03/22   Terrilee Files, MD  risperiDONE (RISPERDAL) 3 MG tablet Take 1 tablet (3 mg total) by mouth 2 (two) times daily. 06/19/18   Malvin Johns, MD  SUMAtriptan (IMITREX) 50 MG tablet Take 1 tablet by mouth as needed for migraine. May repeat in 2 hours if headache persists or recurs. (Please do not take more than 2 tablets in 24 hours.). 01/13/22   Peter Garter, PA      Allergies    Patient has no known allergies.    Review of Systems   Review of Systems  Constitutional:  Negative for activity change, appetite change and fever.  HENT:  Negative for congestion and rhinorrhea.   Respiratory:  Negative for cough and shortness of breath.   Cardiovascular:  Negative for chest pain.  Gastrointestinal:  Positive for abdominal pain and constipation. Negative for nausea and vomiting.  Genitourinary:  Negative for dysuria and hematuria.  Musculoskeletal:  Negative for arthralgias and myalgias.  Neurological:  Negative for weakness and headaches.   all other systems are negative except as noted in the HPI and PMH.    Physical Exam Updated Vital Signs BP 125/82   Pulse 88   Temp 98.1 F (36.7 C) (Oral)   Resp 16   Ht 5\' 6"  (1.676 m)   Wt  73 kg   LMP 01/25/2022 (Exact Date)   SpO2 100%   BMI 25.99 kg/m  Physical Exam Vitals and nursing note reviewed.  Constitutional:      General: She is not in acute distress.    Appearance: She is well-developed.  HENT:     Head: Normocephalic and atraumatic.     Mouth/Throat:     Pharynx: No oropharyngeal exudate.  Eyes:     Conjunctiva/sclera: Conjunctivae normal.     Pupils: Pupils are equal, round, and reactive to light.  Neck:     Comments: No meningismus. Cardiovascular:     Rate and Rhythm: Normal rate and regular rhythm.     Heart sounds: Normal heart sounds. No murmur heard. Pulmonary:     Effort: Pulmonary effort is normal. No respiratory  distress.     Breath sounds: Normal breath sounds.  Abdominal:     Palpations: Abdomen is soft.     Tenderness: There is abdominal tenderness. There is no guarding or rebound.     Comments: Left lower quadrant pain, no guarding or rebound.  Musculoskeletal:        General: No tenderness. Normal range of motion.     Cervical back: Normal range of motion and neck supple.  Skin:    General: Skin is warm.  Neurological:     Mental Status: She is alert and oriented to person, place, and time.     Cranial Nerves: No cranial nerve deficit.     Motor: No abnormal muscle tone.     Coordination: Coordination normal.     Comments:  5/5 strength throughout. CN 2-12 intact.Equal grip strength.   Psychiatric:        Behavior: Behavior normal.     ED Results / Procedures / Treatments   Labs (all labs ordered are listed, but only abnormal results are displayed) Labs Reviewed  URINALYSIS, ROUTINE W REFLEX MICROSCOPIC  PREGNANCY, URINE    EKG None  Radiology DG Chest Port 1 View  Result Date: 02/06/2022 CLINICAL DATA:  Left chest wall pain EXAM: PORTABLE CHEST 1 VIEW COMPARISON:  01/08/2022 FINDINGS: The heart size and mediastinal contours are within normal limits. Both lungs are clear. The visualized skeletal structures are unremarkable. IMPRESSION: No active disease. Electronically Signed   By: Sharlet Salina M.D.   On: 02/06/2022 22:10    Procedures Procedures    Medications Ordered in ED Medications - No data to display  ED Course/ Medical Decision Making/ A&P                           Medical Decision Making Amount and/or Complexity of Data Reviewed Labs: ordered. Decision-making details documented in ED Course. Radiology: ordered and independent interpretation performed. Decision-making details documented in ED Course. ECG/medicine tests: ordered and independent interpretation performed. Decision-making details documented in ED Course.  Risk Prescription drug  management.   Recurrent left-sided abdominal pain multiple recent visits for same.  Vital stable, no distress.  No fever. CT scan 4 days ago was reassuring.  Abdomen soft without peritoneal signs.  Low suspicion for appendicitis, cholecystitis, bowel obstruction, ovarian torsion or TOA.  Urinalysis negative, hCG negative.  Patient tolerating p.o. and speaking on the phone with no distress.  Appears stable for discharge.       Final Clinical Impression(s) / ED Diagnoses Final diagnoses:  Generalized abdominal pain    Rx / DC Orders ED Discharge Orders     None  Glynn Octave, MD 02/07/22 2316

## 2022-02-07 NOTE — ED Notes (Signed)
Says she doesn't want the shot or tylenol. Ambulatory on discharge.

## 2022-02-08 ENCOUNTER — Emergency Department (HOSPITAL_COMMUNITY)
Admission: EM | Admit: 2022-02-08 | Discharge: 2022-02-08 | Disposition: A | Discharge status: Home / Self Care | Payer: Medicaid Other | Attending: Emergency Medicine | Admitting: Emergency Medicine

## 2022-02-08 ENCOUNTER — Encounter (HOSPITAL_COMMUNITY): Payer: Self-pay

## 2022-02-08 ENCOUNTER — Other Ambulatory Visit: Payer: Self-pay

## 2022-02-08 DIAGNOSIS — R519 Headache, unspecified: Secondary | ICD-10-CM | POA: Diagnosis present

## 2022-02-08 MED ORDER — IBUPROFEN 800 MG PO TABS
800.0000 mg | ORAL_TABLET | Freq: Once | ORAL | Status: AC
  Administered 2022-02-08: 800 mg via ORAL
  Filled 2022-02-08: qty 1

## 2022-02-08 NOTE — Discharge Instructions (Addendum)

## 2022-02-08 NOTE — ED Triage Notes (Signed)
Patient reports that she began having a headache and nausea last night. Patient denies vomiting, dizziness, or blurred vision.

## 2022-02-08 NOTE — ED Provider Notes (Signed)
[facility]-EMERGENCY DEPT Provider Note   CSN: [id number] Arrival date & time: 02/08/22  1648     History  Chief Complaint  Patient presents with   Headache    Amanda Burnett is a 36 y.o. female with a past medical history of paranoid schizophrenia and anxiety.  This is her 39th visit in the last 6 months.  She is well-known to all of our emergency departments.  Patient states that she is having moderate headache.  She did not take any medication prior to arrival.  She states she felt her headache coming on and just came directly to the emergency room. Denies photophobia, phonophobia, UL throbbing, N/V, visual changes, stiff neck, neck pain, rash, or "thunderclap" onset.     Headache      Home Medications Prior to Admission medications   Medication Sig Start Date End Date Taking? Authorizing Provider  ARIPiprazole ER (ABILIFY MAINTENA) 400 MG SRER injection Inject 2 mLs (400 mg total) into the muscle every 28 (twenty-eight) days. 06/19/18   Malvin Johns, MD  benztropine (COGENTIN) 0.5 MG tablet Take 1 tablet (0.5 mg total) by mouth 2 (two) times daily. 06/19/18   Malvin Johns, MD  cyclobenzaprine (FLEXERIL) 10 MG tablet Take 1 tablet (10 mg total) by mouth 2 (two) times daily as needed for muscle spasms. 01/20/22   Bethann Berkshire, MD  dicyclomine (BENTYL) 20 MG tablet Take 1 tablet (20 mg total) by mouth 2 (two) times daily. 12/31/21   Linwood Dibbles, MD  etodolac (LODINE) 300 MG capsule Take 1 capsule (300 mg total) by mouth every 8 (eight) hours as needed for pain 12/31/21   Linwood Dibbles, MD  ibuprofen (ADVIL) 800 MG tablet Take 1 tablet (800 mg total) by mouth 3 (three) times daily. 01/20/22   Bethann Berkshire, MD  pantoprazole (PROTONIX) 20 MG tablet Take 1 tablet (20 mg total) by mouth daily. 02/03/22   Terrilee Files, MD  risperiDONE (RISPERDAL) 3 MG tablet Take 1 tablet (3 mg total) by mouth 2 (two) times daily. 06/19/18   Malvin Johns, MD  SUMAtriptan (IMITREX)  50 MG tablet Take 1 tablet by mouth as needed for migraine. May repeat in 2 hours if headache persists or recurs. (Please do not take more than 2 tablets in 24 hours.). 01/13/22   Peter Garter, PA      Allergies    Patient has no known allergies.    Review of Systems   Review of Systems  Neurological:  Positive for headaches.    Physical Exam Updated Vital Signs BP 122/74 (BP Location: Left Arm)   Pulse (!) 101   Temp 98.7 F (37.1 C) (Oral)   Resp 18   Ht 5\' 6"  (1.676 m)   Wt 73 kg   LMP 01/25/2022 (Exact Date)   SpO2 99%   BMI 25.99 kg/m  Physical Exam Physical Exam  Constitutional: Pt is oriented to person, place, and time. Pt appears well-developed and well-nourished. No distress.  HENT:  Head: Normocephalic and atraumatic.  Mouth/Throat: Oropharynx is clear and moist.  Eyes: Conjunctivae and EOM are normal. Pupils are equal, round, and reactive to light. No scleral icterus.  No horizontal, vertical or rotational nystagmus  Neck: Normal range of motion. Neck supple.  Full active and passive ROM without pain No midline or paraspinal tenderness No nuchal rigidity or meningeal signs  Cardiovascular: Normal rate, regular rhythm and intact distal pulses.   Pulmonary/Chest: Effort normal and breath sounds normal. No respiratory distress. Pt  has no wheezes. No rales.  Abdominal: Soft. Bowel sounds are normal. There is no tenderness. There is no rebound and no guarding.  Musculoskeletal: Normal range of motion.  Lymphadenopathy:    No cervical adenopathy.  Neurological: Pt. is alert and oriented to person, place, and time. He has normal reflexes. No cranial nerve deficit.  Exhibits normal muscle tone. Coordination normal.  Mental Status:  Alert, oriented, thought content appropriate. Speech fluent without evidence of aphasia. Able to follow 2 step commands without difficulty.  Cranial Nerves:  II:  Peripheral visual fields grossly normal, pupils equal, round, reactive  to light III,IV, VI: ptosis not present, extra-ocular motions intact bilaterally  V,VII: smile symmetric, facial light touch sensation equal VIII: hearing grossly normal bilaterally  IX,X: midline uvula rise  XI: bilateral shoulder shrug equal and strong XII: midline tongue extension  Motor:  5/5 in upper and lower extremities bilaterally including strong and equal grip strength and dorsiflexion/plantar flexion Sensory: Pinprick and light touch normal in all extremities.  Deep Tendon Reflexes: 2+ and symmetric  Cerebellar: normal finger-to-nose with bilateral upper extremities Gait: normal gait and balance CV: distal pulses palpable throughout   Skin: Skin is warm and dry. No rash noted. Pt is not diaphoretic.  Psychiatric: Pt has a normal mood and affect. Behavior is normal. Judgment and thought content normal.  Nursing note and vitals reviewed.  ED Results / Procedures / Treatments   Labs (all labs ordered are listed, but only abnormal results are displayed) Labs Reviewed - No data to display  EKG None  Radiology DG Chest [facility] 1 View  Result Date: 02/06/2022 CLINICAL DATA:  Left chest wall pain EXAM: PORTABLE CHEST 1 VIEW COMPARISON:  01/08/2022 FINDINGS: The heart size and mediastinal contours are within normal limits. Both lungs are clear. The visualized skeletal structures are unremarkable. IMPRESSION: No active disease. Electronically Signed   By: Sharlet Salina M.D.   On: 02/06/2022 22:10    Procedures Procedures    Medications Ordered in ED Medications - No data to display  ED Course/ Medical Decision Making/ A&P                           Medical Decision Making Amanda Burnett presents with headache Given the large differential diagnosis for Amanda Burnett, the decision making in this case is of high complexity.  After evaluating all of the data points in this case, the presentation of Amanda Burnett is NOT consistent with skull fracture,  meningitis/encephalitis, SAH/sentinel bleed, Intracranial Hemorrhage (ICH) (subdural/epidural), acute obstructive hydrocephalus, space occupying lesions, CVA, CO Poisoning, Basilar/vertebral artery dissection, preeclampsia, cerebral venous thrombosis, hypertensive emergency, temporal Arteritis, Idiopathic Intracranial Hypertension (pseudotumor cerebri).  Strict return and follow-up precautions have been given by me personally or by detailed written instructions verbalized by nursing staff using the teach back method to patient/family/caregiver.  Data Reviewed/Counseling: I have reviewed the patient's vital signs, nursing notes, and other relevant tests/information. I had a detailed discussion regarding the historical points, exam findings, and any diagnostic results supporting the discharge diagnosis. I also discussed the need for outpatient follow-up and the need to return to the ED if symptoms worsen or if there are any questions or concerns that arise at hom             Final Clinical Impression(s) / ED Diagnoses Final diagnoses:  None    Rx / DC Orders ED Discharge Orders     None  Arthor Captain, PA-C 02/08/22 1710    Terald Sleeper, MD 02/08/22 [phone]

## 2022-02-09 ENCOUNTER — Other Ambulatory Visit (HOSPITAL_COMMUNITY): Payer: Self-pay

## 2022-02-09 ENCOUNTER — Emergency Department (HOSPITAL_COMMUNITY)
Admission: EM | Admit: 2022-02-09 | Discharge: 2022-02-09 | Disposition: A | Discharge status: Home / Self Care | Payer: Medicaid Other | Attending: Emergency Medicine | Admitting: Emergency Medicine

## 2022-02-09 ENCOUNTER — Other Ambulatory Visit: Payer: Self-pay

## 2022-02-09 DIAGNOSIS — K0889 Other specified disorders of teeth and supporting structures: Secondary | ICD-10-CM | POA: Insufficient documentation

## 2022-02-09 MED ORDER — IBUPROFEN 400 MG PO TABS
600.0000 mg | ORAL_TABLET | Freq: Once | ORAL | Status: AC
  Administered 2022-02-09: 600 mg via ORAL
  Filled 2022-02-09: qty 1

## 2022-02-09 MED ORDER — LIDOCAINE VISCOUS HCL 2 % MT SOLN
15.0000 mL | Freq: Once | OROMUCOSAL | Status: AC
  Administered 2022-02-09: 15 mL via OROMUCOSAL
  Filled 2022-02-09: qty 15

## 2022-02-09 MED ORDER — ACETAMINOPHEN 500 MG PO TABS
500.0000 mg | ORAL_TABLET | Freq: Four times a day (QID) | ORAL | 0 refills | Status: AC | PRN
  Filled 2022-02-09: qty 30, 8d supply, fill #0

## 2022-02-09 MED ORDER — IBUPROFEN 600 MG PO TABS
600.0000 mg | ORAL_TABLET | Freq: Four times a day (QID) | ORAL | 0 refills | Status: AC | PRN
  Filled 2022-02-09: qty 30, 8d supply, fill #0

## 2022-02-09 MED ORDER — AMOXICILLIN-POT CLAVULANATE 875-125 MG PO TABS
1.0000 | ORAL_TABLET | Freq: Two times a day (BID) | ORAL | 0 refills | Status: AC
  Filled 2022-02-09: qty 14, 7d supply, fill #0

## 2022-02-09 MED ORDER — ACETAMINOPHEN 325 MG PO TABS
650.0000 mg | ORAL_TABLET | Freq: Once | ORAL | Status: AC
  Administered 2022-02-09: 650 mg via ORAL
  Filled 2022-02-09: qty 2

## 2022-02-09 NOTE — ED Provider Notes (Signed)
[facility] EMERGENCY DEPARTMENT Provider Note   CSN: [id number] Arrival date & time: 02/09/22  1450     History  Chief Complaint  Patient presents with   Dental Pain    Amanda Burnett is a 36 y.o. female.  Patient as above with significant medical history as below, including schizophrenia, anxiety, depression, well known to ED who presents to the ED with complaint of right sided dental pain Very well known to the emergency dept, was seen 8 visits in the past week Pt with multiple damaged teeth, poor denitition Reports pain to her lower right side teeth over last 24 hours No dysphagia or dysphonia No fevers or chills No trauma No facial swelling, no eyelid swelling, no pain with eye movement or pain w/ neck movement  No medications pta    Past Medical History:  Diagnosis Date   Anxiety    Depression     No past surgical history on file.   The history is provided by the patient. No language interpreter was used.  Dental Pain Associated symptoms: no facial swelling, no fever and no headaches        Home Medications Prior to Admission medications   Medication Sig Start Date End Date Taking? Authorizing Provider  acetaminophen (TYLENOL) 500 MG tablet Take 1 tablet (500 mg total) by mouth every 6 (six) hours as needed. 02/09/22  Yes Tanda Rockers A, DO  amoxicillin-clavulanate (AUGMENTIN) 875-125 MG tablet Take 1 tablet by mouth every 12 (twelve) hours. 02/09/22  Yes Tanda Rockers A, DO  ibuprofen (ADVIL) 600 MG tablet Take 1 tablet (600 mg total) by mouth every 6 (six) hours as needed. 02/09/22  Yes Tanda Rockers A, DO  ARIPiprazole ER (ABILIFY MAINTENA) 400 MG SRER injection Inject 2 mLs (400 mg total) into the muscle every 28 (twenty-eight) days. 06/19/18   Malvin Johns, MD  benztropine (COGENTIN) 0.5 MG tablet Take 1 tablet (0.5 mg total) by mouth 2 (two) times daily. 06/19/18   Malvin Johns, MD  cyclobenzaprine (FLEXERIL) 10 MG tablet Take 1 tablet (10  mg total) by mouth 2 (two) times daily as needed for muscle spasms. 01/20/22   Bethann Berkshire, MD  dicyclomine (BENTYL) 20 MG tablet Take 1 tablet (20 mg total) by mouth 2 (two) times daily. 12/31/21   Linwood Dibbles, MD  etodolac (LODINE) 300 MG capsule Take 1 capsule (300 mg total) by mouth every 8 (eight) hours as needed for pain 12/31/21   Linwood Dibbles, MD  ibuprofen (ADVIL) 800 MG tablet Take 1 tablet (800 mg total) by mouth 3 (three) times daily. 01/20/22   Bethann Berkshire, MD  pantoprazole (PROTONIX) 20 MG tablet Take 1 tablet (20 mg total) by mouth daily. 02/03/22   Terrilee Files, MD  risperiDONE (RISPERDAL) 3 MG tablet Take 1 tablet (3 mg total) by mouth 2 (two) times daily. 06/19/18   Malvin Johns, MD  SUMAtriptan (IMITREX) 50 MG tablet Take 1 tablet by mouth as needed for migraine. May repeat in 2 hours if headache persists or recurs. (Please do not take more than 2 tablets in 24 hours.). 01/13/22   Peter Garter, PA      Allergies    Patient has no known allergies.    Review of Systems   Review of Systems  Constitutional:  Negative for chills and fever.  HENT:  Positive for dental problem. Negative for facial swelling and trouble swallowing.   Eyes:  Negative for photophobia and visual disturbance.  Respiratory:  Negative  for cough and shortness of breath.   Cardiovascular:  Negative for chest pain and palpitations.  Gastrointestinal:  Negative for abdominal pain, nausea and vomiting.  Endocrine: Negative for polydipsia and polyuria.  Genitourinary:  Negative for difficulty urinating and hematuria.  Musculoskeletal:  Negative for gait problem and joint swelling.  Skin:  Negative for pallor and rash.  Neurological:  Negative for syncope and headaches.  Psychiatric/Behavioral:  Negative for agitation and confusion.     Physical Exam Updated Vital Signs BP 118/66   Pulse 93   Temp 98.7 F (37.1 C)   Resp 14   LMP 01/25/2022 (Exact Date)   SpO2 100%  Physical Exam Vitals  and nursing note reviewed.  Constitutional:      General: She is not in acute distress.    Appearance: Normal appearance.  HENT:     Head: Normocephalic and atraumatic.     Right Ear: External ear normal.     Left Ear: External ear normal.     Nose: Nose normal.     Mouth/Throat:     Mouth: Mucous membranes are moist.     Dentition: Abnormal dentition.      Comments: No dental abscess Multiple broken teeth/ dental caries  Eyes:     General: No scleral icterus.       Right eye: No discharge.        Left eye: No discharge.  Neck:     Trachea: Trachea normal.  Cardiovascular:     Rate and Rhythm: Normal rate and regular rhythm.  Pulmonary:     Effort: Pulmonary effort is normal. No respiratory distress.  Abdominal:     General: Abdomen is flat.     Tenderness: There is no abdominal tenderness.  Musculoskeletal:        General: Normal range of motion.     Cervical back: Full passive range of motion without pain and normal range of motion.  Skin:    General: Skin is warm and dry.     Capillary Refill: Capillary refill takes less than 2 seconds.  Neurological:     Mental Status: She is alert and oriented to person, place, and time.     GCS: GCS eye subscore is 4. GCS verbal subscore is 5. GCS motor subscore is 6.  Psychiatric:        Mood and Affect: Mood normal.        Behavior: Behavior normal.     ED Results / Procedures / Treatments   Labs (all labs ordered are listed, but only abnormal results are displayed) Labs Reviewed - No data to display  EKG None  Radiology No results found.  Procedures Procedures    Medications Ordered in ED Medications  ibuprofen (ADVIL) tablet 600 mg (has no administration in time range)  acetaminophen (TYLENOL) tablet 650 mg (has no administration in time range)  lidocaine (XYLOCAINE) 2 % viscous mouth solution 15 mL (has no administration in time range)    ED Course/ Medical Decision Making/ A&P                            Medical Decision Making Risk OTC drugs. Prescription drug management.   Patient presenting for the above primary dental complaint. Likely dental infection, with no signs of facial or intra-oral abscess. Pt was started on Abx.No signs of infection, facial swelling or abscess. Given pain meds for home, will alternate between Motrin and Tylenol, tid Listerine rinses, Orajel  prn, brushing, and given list of outpatient dental resources.    Patient in no distress and overall condition improved here in the ED. Detailed discussions were had with the patient regarding current findings, and need for close f/u with PCP or on call doctor. The patient has been instructed to return immediately if the symptoms worsen in any way for re-evaluation. Patient verbalized understanding and is in agreement with current care plan. All questions answered prior to discharge.        Final Clinical Impression(s) / ED Diagnoses Final diagnoses:  Pain, dental    Rx / DC Orders ED Discharge Orders          Ordered    amoxicillin-clavulanate (AUGMENTIN) 875-125 MG tablet  Every 12 hours        02/09/22 1659    ibuprofen (ADVIL) 600 MG tablet  Every 6 hours PRN        02/09/22 1659    acetaminophen (TYLENOL) 500 MG tablet  Every 6 hours PRN        02/09/22 1659              Sloan Leiter, DO 02/09/22 1710

## 2022-02-09 NOTE — Discharge Instructions (Addendum)
It was a pleasure caring for you today in the emergency department.  Please return to the emergency department for any worsening or worrisome symptoms.  Please use listerine mouth wash swish/spit 3 x daily Use warm salt water gargle daily Use oragel as needed topically  Please follow up with dentist

## 2022-02-09 NOTE — ED Triage Notes (Signed)
Pt reports R lower dental pain since last night and front upper dental pain for a few hours today.  Denies fever.

## 2022-02-10 ENCOUNTER — Other Ambulatory Visit: Payer: Self-pay

## 2022-02-10 ENCOUNTER — Emergency Department (HOSPITAL_COMMUNITY)
Admission: EM | Admit: 2022-02-10 | Discharge: 2022-02-10 | Disposition: A | Discharge status: Home / Self Care | Payer: Medicaid Other | Attending: Emergency Medicine | Admitting: Emergency Medicine

## 2022-02-10 ENCOUNTER — Other Ambulatory Visit (HOSPITAL_COMMUNITY): Payer: Self-pay

## 2022-02-10 DIAGNOSIS — K0889 Other specified disorders of teeth and supporting structures: Secondary | ICD-10-CM | POA: Diagnosis present

## 2022-02-10 MED ORDER — LIDOCAINE VISCOUS HCL 2 % MT SOLN
15.0000 mL | Freq: Once | OROMUCOSAL | Status: AC
  Administered 2022-02-10: 15 mL via OROMUCOSAL
  Filled 2022-02-10: qty 15

## 2022-02-10 MED ORDER — BENZOCAINE 10 % MT GEL
1.0000 | Freq: Four times a day (QID) | OROMUCOSAL | 0 refills | Status: AC | PRN
  Filled 2022-02-10: qty 9, 3d supply, fill #0

## 2022-02-10 NOTE — ED Provider Notes (Signed)
[facility]-EMERGENCY DEPT Provider Note   CSN: [id number] Arrival date & time: 02/10/22  1155     History  Chief Complaint  Patient presents with   Dental Problem    Amanda Burnett is a 36 y.o. female with a past medical history significant for schizophrenia, anxiety, and depression presents to the ED due to dental pain that has been ongoing for numerous months.  Patient requesting Percocet for her dental pain.  Admits to some facial edema.  Unsure when she last saw a dentist. Seen yesterday for the same complaint and discharged with Tylenol and Augmentin.  She admits to taking 1 dose of Augmentin.  Patient requesting pain medication to get her through the day.  Patient has been seen numerous times in the ED over the past few weeks for different complaints.  History obtained from patient and past medical records. No interpreter used during encounter.       Home Medications Prior to Admission medications   Medication Sig Start Date End Date Taking? Authorizing Provider  benzocaine (ORAJEL) 10 % mucosal gel Use as directed 1 Application in the mouth or throat 4 (four) times daily as needed for mouth pain. 02/10/22  Yes Astraea Gaughran, Merla Riches, PA-C  acetaminophen (TYLENOL) 500 MG tablet Take 1 tablet (500 mg total) by mouth every 6 (six) hours as needed. 02/09/22   Sloan Leiter, DO  amoxicillin-clavulanate (AUGMENTIN) 875-125 MG tablet Take 1 tablet by mouth every 12 (twelve) hours. 02/09/22   Sloan Leiter, DO  ARIPiprazole ER (ABILIFY MAINTENA) 400 MG SRER injection Inject 2 mLs (400 mg total) into the muscle every 28 (twenty-eight) days. 06/19/18   Malvin Johns, MD  benztropine (COGENTIN) 0.5 MG tablet Take 1 tablet (0.5 mg total) by mouth 2 (two) times daily. 06/19/18   Malvin Johns, MD  cyclobenzaprine (FLEXERIL) 10 MG tablet Take 1 tablet (10 mg total) by mouth 2 (two) times daily as needed for muscle spasms. 01/20/22   Bethann Berkshire, MD  dicyclomine (BENTYL) 20  MG tablet Take 1 tablet (20 mg total) by mouth 2 (two) times daily. 12/31/21   Linwood Dibbles, MD  etodolac (LODINE) 300 MG capsule Take 1 capsule (300 mg total) by mouth every 8 (eight) hours as needed for pain 12/31/21   Linwood Dibbles, MD  ibuprofen (ADVIL) 600 MG tablet Take 1 tablet (600 mg total) by mouth every 6 (six) hours as needed. 02/09/22   Sloan Leiter, DO  ibuprofen (ADVIL) 800 MG tablet Take 1 tablet (800 mg total) by mouth 3 (three) times daily. 01/20/22   Bethann Berkshire, MD  pantoprazole (PROTONIX) 20 MG tablet Take 1 tablet (20 mg total) by mouth daily. 02/03/22   Terrilee Files, MD  risperiDONE (RISPERDAL) 3 MG tablet Take 1 tablet (3 mg total) by mouth 2 (two) times daily. 06/19/18   Malvin Johns, MD  SUMAtriptan (IMITREX) 50 MG tablet Take 1 tablet by mouth as needed for migraine. May repeat in 2 hours if headache persists or recurs. (Please do not take more than 2 tablets in 24 hours.). 01/13/22   Peter Garter, PA      Allergies    Patient has no known allergies.    Review of Systems   Review of Systems  Constitutional:  Negative for chills and fever.  HENT:  Positive for dental problem. Negative for trouble swallowing and voice change.   All other systems reviewed and are negative.   Physical Exam Updated Vital Signs BP 132/82  Pulse (!) 105   Temp 98.4 F (36.9 C) (Oral)   Resp 16   Ht 5\' 6"  (1.676 m)   Wt 73 kg   LMP 01/25/2022 (Exact Date)   SpO2 99%   BMI 25.99 kg/m  Physical Exam Vitals and nursing note reviewed.  Constitutional:      General: She is not in acute distress.    Appearance: She is not ill-appearing.  HENT:     Head: Normocephalic.     Mouth/Throat:     Comments: Poor dentition throughout.  Missing teeth.  Tenderness throughout left lower posterior molars.  Tongue in normal position without protrusion.  No facial edema.  Normal phonation.  Tolerating oral secretions without difficulty.  No abscess. Eyes:     Pupils: Pupils are equal,  round, and reactive to light.  Cardiovascular:     Rate and Rhythm: Normal rate and regular rhythm.     Pulses: Normal pulses.     Heart sounds: Normal heart sounds. No murmur heard.    No friction rub. No gallop.  Pulmonary:     Effort: Pulmonary effort is normal.     Breath sounds: Normal breath sounds.  Abdominal:     General: Abdomen is flat. There is no distension.     Palpations: Abdomen is soft.     Tenderness: There is no abdominal tenderness. There is no guarding or rebound.  Musculoskeletal:        General: Normal range of motion.     Cervical back: Neck supple.  Skin:    General: Skin is warm and dry.  Neurological:     General: No focal deficit present.     Mental Status: She is alert.  Psychiatric:        Mood and Affect: Mood normal.        Behavior: Behavior normal.     ED Results / Procedures / Treatments   Labs (all labs ordered are listed, but only abnormal results are displayed) Labs Reviewed - No data to display  EKG None  Radiology No results found.  Procedures Procedures    Medications Ordered in ED Medications  lidocaine (XYLOCAINE) 2 % viscous mouth solution 15 mL (has no administration in time range)    ED Course/ Medical Decision Making/ A&P                           Medical Decision Making Risk Prescription drug management.   36 year old female presents to the ED due to ongoing dental pain.  Seen yesterday for same complaint and given Augmentin and Tylenol. Patient requesting Percocet for her dental pain.  Unsure when she last saw dentist.  No fever or chills.  Upon arrival, stable vitals.  Patient in no acute distress.  Poor dentition throughout.  No abscess.  Tongue in normal position without protrusion. Low suspicion for Ludwig's or deep space infection. I offered patient a Toradol shot or another NSAID which she declined. She continuously asked for an opioid which I do not feel is warranted at this time. Patient given Lidocaine  solution. Advised patient to take her Augmentin and Tylenol as previously prescribed.  Dental resources given to patient at discharge. Strict ED precautions discussed with patient. Patient states understanding and agrees to plan. Patient discharged home in no acute distress and stable vitals.  No PCP Hx of schizophrenia Numerous ED visits       Final Clinical Impression(s) / ED Diagnoses Final diagnoses:  Pain, dental    Rx / DC Orders ED Discharge Orders          Ordered    benzocaine (ORAJEL) 10 % mucosal gel  4 times daily PRN        02/10/22 1605              Jesusita Oka 02/10/22 1605    Linwood Dibbles, MD 02/11/22 1948

## 2022-02-10 NOTE — ED Provider Triage Note (Signed)
Emergency Medicine Provider Triage Evaluation Note  Amanda Burnett , a 36 y.o. female  was evaluated in triage.  Pt complains of dental pain, and facial swelling.  Was seen for same complaint yesterday at [facility].  He was prescribed antibiotics as well as 100 mg ibuprofen.  He reports no improvement.  Does not have a dentist.  Afebrile.  Well-appearing and without acute distress..  Review of Systems  Positive: As above Negative: As above  Physical Exam  BP 132/82   Pulse (!) 105   Temp 98.4 F (36.9 C) (Oral)   Resp 16   LMP 01/25/2022 (Exact Date)   SpO2 99%  Gen:   Awake, no distress   Resp:  Normal effort  MSK:   Moves extremities without difficulty  Other:    Medical Decision Making  Medically screening exam initiated at 3:18 PM.  Appropriate orders placed.  Amanda Burnett was informed that the remainder of the evaluation will be completed by another provider, this initial triage assessment does not replace that evaluation, and the importance of remaining in the ED until their evaluation is complete.    Marita Kansas, PA-C 02/10/22 1519

## 2022-02-10 NOTE — Discharge Instructions (Signed)
It was a pleasure taking care of you today.  As discussed, you need to see a dentist for further evaluation of your dental pain.  I have included the resources in community.  Continue taking your Augmentin and Tylenol.  No evidence of a large infection.  Return to the ER for new or worsening symptoms.

## 2022-02-10 NOTE — ED Triage Notes (Signed)
Pt via POV c/o mouth pain and facial swelling due to dental infection. She is already taking amoxicillin and has rx for lidocaine but has not been using it because it makes her mouth feel funny. She took tylenol and advil at home with no improvement. Left upper mouth swelling and right lower mouth infection per pt report. Pain rated 10/10

## 2022-02-11 ENCOUNTER — Other Ambulatory Visit: Payer: Self-pay

## 2022-02-11 ENCOUNTER — Emergency Department (HOSPITAL_COMMUNITY)
Admission: EM | Admit: 2022-02-11 | Discharge: 2022-02-12 | Disposition: A | Discharge status: Home / Self Care | Payer: Medicaid Other | Attending: Emergency Medicine | Admitting: Emergency Medicine

## 2022-02-11 ENCOUNTER — Encounter (HOSPITAL_COMMUNITY): Payer: Self-pay

## 2022-02-11 DIAGNOSIS — K029 Dental caries, unspecified: Secondary | ICD-10-CM | POA: Insufficient documentation

## 2022-02-11 DIAGNOSIS — K047 Periapical abscess without sinus: Secondary | ICD-10-CM | POA: Diagnosis not present

## 2022-02-11 DIAGNOSIS — R220 Localized swelling, mass and lump, head: Secondary | ICD-10-CM | POA: Diagnosis present

## 2022-02-11 NOTE — ED Triage Notes (Signed)
Mouth continues to hurt. Seen yesterday and started on abx. Says it is still painful to eat.

## 2022-02-12 ENCOUNTER — Other Ambulatory Visit: Payer: Self-pay

## 2022-02-12 ENCOUNTER — Encounter (HOSPITAL_COMMUNITY): Payer: Self-pay

## 2022-02-12 ENCOUNTER — Emergency Department (HOSPITAL_COMMUNITY)
Admission: EM | Admit: 2022-02-12 | Discharge: 2022-02-13 | Disposition: A | Discharge status: Home / Self Care | Payer: Medicaid Other | Attending: Emergency Medicine | Admitting: Emergency Medicine

## 2022-02-12 DIAGNOSIS — K029 Dental caries, unspecified: Secondary | ICD-10-CM | POA: Diagnosis not present

## 2022-02-12 DIAGNOSIS — K047 Periapical abscess without sinus: Secondary | ICD-10-CM

## 2022-02-12 DIAGNOSIS — M25561 Pain in right knee: Secondary | ICD-10-CM | POA: Insufficient documentation

## 2022-02-12 DIAGNOSIS — K0889 Other specified disorders of teeth and supporting structures: Secondary | ICD-10-CM | POA: Diagnosis present

## 2022-02-12 MED ORDER — NAPROXEN 500 MG PO TABS
500.0000 mg | ORAL_TABLET | Freq: Once | ORAL | Status: AC
  Administered 2022-02-12: 500 mg via ORAL
  Filled 2022-02-12: qty 1

## 2022-02-12 NOTE — Discharge Instructions (Addendum)
It is important to take your antibiotics and see a dentist. If it is too painful to chew, try soups and broths. Take 600mg Advil Liquid Gels with 650mg Tylenol every 6 hours as needed for pain. See resource guide for dental follow up. Also provided with referral to oral surgery and dentist, call to schedule an appointment. 

## 2022-02-12 NOTE — ED Provider Notes (Signed)
[facility]  HOSPITAL-EMERGENCY DEPT Provider Note   CSN: [id number] Arrival date & time: 02/11/22  1902     History  Chief Complaint  Patient presents with   Dental Pain    Amanda Burnett is a 36 y.o. female.  36 year old female with complaint of mouth pain and infection, swelling. Unable to take antibiotics because they can't eat because it hurts to chew so they won't take their antibiotics on an empty stomach.        Home Medications Prior to Admission medications   Medication Sig Start Date End Date Taking? Authorizing Provider  acetaminophen (TYLENOL) 500 MG tablet Take 1 tablet (500 mg total) by mouth every 6 (six) hours as needed. 02/09/22   Sloan Leiter, DO  amoxicillin-clavulanate (AUGMENTIN) 875-125 MG tablet Take 1 tablet by mouth every 12 (twelve) hours. 02/09/22   Sloan Leiter, DO  ARIPiprazole ER (ABILIFY MAINTENA) 400 MG SRER injection Inject 2 mLs (400 mg total) into the muscle every 28 (twenty-eight) days. 06/19/18   Malvin Johns, MD  benzocaine (ORAJEL) 10 % mucosal gel Use as directed 1 Application in the mouth or throat 4 (four) times daily as needed for mouth pain. 02/10/22   Mannie Stabile, PA-C  benztropine (COGENTIN) 0.5 MG tablet Take 1 tablet (0.5 mg total) by mouth 2 (two) times daily. 06/19/18   Malvin Johns, MD  cyclobenzaprine (FLEXERIL) 10 MG tablet Take 1 tablet (10 mg total) by mouth 2 (two) times daily as needed for muscle spasms. 01/20/22   Bethann Berkshire, MD  dicyclomine (BENTYL) 20 MG tablet Take 1 tablet (20 mg total) by mouth 2 (two) times daily. 12/31/21   Linwood Dibbles, MD  etodolac (LODINE) 300 MG capsule Take 1 capsule (300 mg total) by mouth every 8 (eight) hours as needed for pain 12/31/21   Linwood Dibbles, MD  ibuprofen (ADVIL) 600 MG tablet Take 1 tablet (600 mg total) by mouth every 6 (six) hours as needed. 02/09/22   Sloan Leiter, DO  ibuprofen (ADVIL) 800 MG tablet Take 1 tablet (800 mg total) by mouth 3 (three) times  daily. 01/20/22   Bethann Berkshire, MD  pantoprazole (PROTONIX) 20 MG tablet Take 1 tablet (20 mg total) by mouth daily. 02/03/22   Terrilee Files, MD  risperiDONE (RISPERDAL) 3 MG tablet Take 1 tablet (3 mg total) by mouth 2 (two) times daily. 06/19/18   Malvin Johns, MD  SUMAtriptan (IMITREX) 50 MG tablet Take 1 tablet by mouth as needed for migraine. May repeat in 2 hours if headache persists or recurs. (Please do not take more than 2 tablets in 24 hours.). 01/13/22   Peter Garter, PA      Allergies    Patient has no known allergies.    Review of Systems   Review of Systems Negative except as per HPI Physical Exam Updated Vital Signs BP 120/86   Pulse 80   Temp 98.5 F (36.9 C) (Oral)   Resp 16   LMP 01/25/2022 (Exact Date)   SpO2 99%  Physical Exam Vitals and nursing note reviewed.  Constitutional:      General: She is not in acute distress.    Appearance: She is well-developed. She is not diaphoretic.  HENT:     Head: Normocephalic and atraumatic.     Jaw: No trismus.     Nose: Nose normal.     Mouth/Throat:     Mouth: Mucous membranes are moist.     Dentition: Dental  tenderness, gingival swelling and dental caries present. No dental abscesses.  Eyes:     Conjunctiva/sclera: Conjunctivae normal.  Pulmonary:     Effort: Pulmonary effort is normal.  Musculoskeletal:     Cervical back: Neck supple.  Lymphadenopathy:     Cervical: No cervical adenopathy.  Skin:    General: Skin is warm and dry.     Findings: No erythema or rash.  Neurological:     Mental Status: She is alert and oriented to person, place, and time.  Psychiatric:        Behavior: Behavior normal.     ED Results / Procedures / Treatments   Labs (all labs ordered are listed, but only abnormal results are displayed) Labs Reviewed - No data to display  EKG None  Radiology No results found.  Procedures Procedures    Medications Ordered in ED Medications - No data to display  ED  Course/ Medical Decision Making/ A&P                           Medical Decision Making  36 year old female presents with complaint of ongoing mouth pain with the swelling.  Patient was seen here yesterday (02/11/22) for same, provided with lidocaine, seen 02/10/22 and provided with antibiotics.  States she is unable to take antibiotics because she cannot eat food and will take her medicine on an empty stomach.  On exam, patient has numerous decaying teeth to the gumline, no focal or drainable collection.  No trismus.  No sublingual or submandibular swelling.  Offered lidocaine in the ER, declines.  Recommend Advil with Tylenol.  In regards to inability to eat solid foods, recommend that she drink soups and broths to be able to get her antibiotics then.  She is provided with referral to oral surgery as well as dentist and provided with resource guide.  No PCP. Hx schizophrenia.        Final Clinical Impression(s) / ED Diagnoses Final diagnoses:  Dental caries  Dental infection    Rx / DC Orders ED Discharge Orders     None         Jeannie Fend, PA-C 02/12/22 1108    Alvira Monday, MD 02/12/22 2316

## 2022-02-12 NOTE — ED Provider Notes (Signed)
[facility] HOSPITAL-EMERGENCY DEPT Provider Note   CSN: [id number] Arrival date & time: 02/12/22  2043     History  Chief Complaint  Patient presents with   Dental Pain    Amanda Burnett is a 36 y.o. female.  HPI     36 year old female with complaint of mouth pain and infection, swelling. Unable to take antibiotics because they can't eat because it hurts to chew so they won't take their antibiotics on an empty stomach.  Patient here with chief request of wanting narcotic pain medication  Home Medications Prior to Admission medications   Medication Sig Start Date End Date Taking? Authorizing Provider  acetaminophen (TYLENOL) 500 MG tablet Take 1 tablet (500 mg total) by mouth every 6 (six) hours as needed. 02/09/22   Sloan Leiter, DO  amoxicillin-clavulanate (AUGMENTIN) 875-125 MG tablet Take 1 tablet by mouth every 12 (twelve) hours. 02/09/22   Sloan Leiter, DO  ARIPiprazole ER (ABILIFY MAINTENA) 400 MG SRER injection Inject 2 mLs (400 mg total) into the muscle every 28 (twenty-eight) days. 06/19/18   Malvin Johns, MD  benzocaine (ORAJEL) 10 % mucosal gel Use as directed 1 Application in the mouth or throat 4 (four) times daily as needed for mouth pain. 02/10/22   Mannie Stabile, PA-C  benztropine (COGENTIN) 0.5 MG tablet Take 1 tablet (0.5 mg total) by mouth 2 (two) times daily. 06/19/18   Malvin Johns, MD  cyclobenzaprine (FLEXERIL) 10 MG tablet Take 1 tablet (10 mg total) by mouth 2 (two) times daily as needed for muscle spasms. 01/20/22   Bethann Berkshire, MD  dicyclomine (BENTYL) 20 MG tablet Take 1 tablet (20 mg total) by mouth 2 (two) times daily. 12/31/21   Linwood Dibbles, MD  etodolac (LODINE) 300 MG capsule Take 1 capsule (300 mg total) by mouth every 8 (eight) hours as needed for pain 12/31/21   Linwood Dibbles, MD  ibuprofen (ADVIL) 600 MG tablet Take 1 tablet (600 mg total) by mouth every 6 (six) hours as needed. 02/09/22   Sloan Leiter, DO  ibuprofen (ADVIL)  800 MG tablet Take 1 tablet (800 mg total) by mouth 3 (three) times daily. 01/20/22   Bethann Berkshire, MD  pantoprazole (PROTONIX) 20 MG tablet Take 1 tablet (20 mg total) by mouth daily. 02/03/22   Terrilee Files, MD  risperiDONE (RISPERDAL) 3 MG tablet Take 1 tablet (3 mg total) by mouth 2 (two) times daily. 06/19/18   Malvin Johns, MD  SUMAtriptan (IMITREX) 50 MG tablet Take 1 tablet by mouth as needed for migraine. May repeat in 2 hours if headache persists or recurs. (Please do not take more than 2 tablets in 24 hours.). 01/13/22   Peter Garter, PA      Allergies    Patient has no known allergies.    Review of Systems   Review of Systems  HENT:  Positive for dental problem.     Physical Exam Updated Vital Signs BP 124/86 (BP Location: Right Arm)   Pulse 84   Temp 98.2 F (36.8 C) (Oral)   Resp 18   Ht 5\' 6"  (1.676 m)   Wt 72.6 kg   LMP 01/25/2022 (Exact Date)   SpO2 100%   BMI 25.82 kg/m  Physical Exam HENT:     Head: Normocephalic and atraumatic.     Mouth/Throat:     Mouth: Mucous membranes are moist.     Comments: Dental tenderness, gingival swelling and dental caries present. No  dental abscesses.  Eyes:     Pupils: Pupils are equal, round, and reactive to light.  Pulmonary:     Effort: Pulmonary effort is normal. No respiratory distress.  Musculoskeletal:        General: No signs of injury.     Cervical back: Normal range of motion.  Skin:    General: Skin is dry.  Neurological:     Mental Status: She is alert.  Psychiatric:        Speech: Speech normal.        Behavior: Behavior normal.     ED Results / Procedures / Treatments   Labs (all labs ordered are listed, but only abnormal results are displayed) Labs Reviewed - No data to display  EKG None  Radiology No results found.  Procedures Procedures    Medications Ordered in ED Medications  naproxen (NAPROSYN) tablet 500 mg (500 mg Oral Given 02/12/22 2332)    ED Course/ Medical  Decision Making/ A&P                           Medical Decision Making  Patient presents chief complaint of ongoing mouth pain and swelling.  Patient was seen here on the 27th of provide with antibiotics and seen here on the 28th and provided with lidocaine.  She declines to take antibiotics because she cannot eat food when not take her medicine on empty stomach.  It was recommended that she try soup or broth to get feeling her stomach and did not take her medication.  She declines trying to eat soup or broth at this time. On exam, the patient is numerous teeth that are decayed to the gumline with no obvious focal or drainable collection.  Patient has no trismus.  There is no swelling in the submandibular or sublingual area.  The patient declined NSAIDs or acetaminophen.  The patient is here requesting narcotic pain medication. I ordered the patient naproxen upon reassessment the patient is feeling slightly better.  There is no indication for further workup at this time.  The patient has been prescribed antibiotics.  She will need to try soup or broth at home and to take her antibiotics and follow-up with dentistry.  At this time there is no sign of a drainable abscess or worrisome fraction She has been provided referral information for both oral surgery and the dentist and has been provided with a resource guide.       Final Clinical Impression(s) / ED Diagnoses Final diagnoses:  Dental caries  Dental infection    Rx / DC Orders ED Discharge Orders     None         Pamala Duffel 02/12/22 2348    Mardene Sayer, MD 02/13/22 0005

## 2022-02-12 NOTE — ED Triage Notes (Signed)
Continues to have ongoing dental pain after multiple evaluations.

## 2022-02-12 NOTE — Discharge Instructions (Addendum)
It is important to take your antibiotics and see a dentist. If it is too painful to chew, try soups and broths. Take 600mg  Advil Liquid Gels with 650mg  Tylenol every 6 hours as needed for pain. See resource guide for dental follow up. Also provided with referral to oral surgery and dentist, call to schedule an appointment.

## 2022-02-12 NOTE — ED Provider Triage Note (Signed)
Emergency Medicine Provider Triage Evaluation Note  Amanda Burnett , a 36 y.o. female  was evaluated in triage.  Pt complains of upper left and lower right dental pain.  Patient states that she needs narcotic pain medication.  She states she tried to see a dentist but has Medicaid and they were unable to cover her visit.  She was seen earlier today for the same complaint and also once daily for the past 2 days.  She states she was unable to have antibiotics due to being unable to chew the pills.  It was recommended that she try drinking soup but the patient states she has not done so  Review of Systems  Positive: As above Negative: As above  Physical Exam  BP 124/86 (BP Location: Right Arm)   Pulse 84   Temp 98.2 F (36.8 C) (Oral)   Resp 18   Ht 5\' 6"  (1.676 m)   Wt 72.6 kg   LMP 01/25/2022 (Exact Date)   SpO2 100%   BMI 25.82 kg/m  Gen:   Awake, no distress  Resp:  Normal effort  MSK:   Moves extremities without difficulty  Other:    Medical Decision Making  Medically screening exam initiated at 8:58 PM.  Appropriate orders placed.  Amanda Burnett was informed that the remainder of the evaluation will be completed by another provider, this initial triage assessment does not replace that evaluation, and the importance of remaining in the ED until their evaluation is complete.     Amanda Longest, PA-C 02/12/22 2059

## 2022-02-13 ENCOUNTER — Encounter (HOSPITAL_COMMUNITY): Payer: Self-pay | Admitting: Emergency Medicine

## 2022-02-13 ENCOUNTER — Emergency Department (HOSPITAL_COMMUNITY)
Admission: EM | Admit: 2022-02-13 | Discharge: 2022-02-13 | Disposition: A | Discharge status: Home / Self Care | Payer: Medicaid Other | Source: Home / Self Care | Attending: Emergency Medicine | Admitting: Emergency Medicine

## 2022-02-13 DIAGNOSIS — M25561 Pain in right knee: Secondary | ICD-10-CM | POA: Insufficient documentation

## 2022-02-13 NOTE — Discharge Instructions (Signed)
Activity Rest your knee. Avoid activities that cause knee pain. Perform stretching and strengthening exercises as told by your health care provider or physical therapist. Return to your normal activities as told by your health care provider. Ask your health care provider what activities are safe for you. General instructions Take over-the-counter and prescription medicines only as told by your health care provider. Use splints, braces, knee supports, or walking aids as directed by your health care provider. Do not use any products that contain nicotine or tobacco, such as cigarettes, e-cigarettes, and chewing tobacco. These can delay healing. If you need help quitting, ask your health care provider. Keep all follow-up visits. This is important. Contact a health care provider if: Your symptoms get worse. You are not improving with home care.

## 2022-02-13 NOTE — ED Provider Notes (Cosign Needed)
[facility]-EMERGENCY DEPT Provider Note   CSN: [id number] Arrival date & time: 02/13/22  4270     History  Chief Complaint  Patient presents with   Knee Pain    Right    Amanda Burnett is a 36 y.o. female who complains of pain in her right knee.  This is her 43rd visit in the last 6 months.  Patient has a history of schizophrenia and malingering.  Patient complains of anterior knee pain.  She had an x-ray that was negative done on 01/25/2022.Marland Kitchen  She has had no new injuries.  She states she thinks that her knee is broken although she is walking vigorously on the knee.   Knee Pain      Home Medications Prior to Admission medications   Medication Sig Start Date End Date Taking? Authorizing Provider  acetaminophen (TYLENOL) 500 MG tablet Take 1 tablet (500 mg total) by mouth every 6 (six) hours as needed. 02/09/22   Sloan Leiter, DO  amoxicillin-clavulanate (AUGMENTIN) 875-125 MG tablet Take 1 tablet by mouth every 12 (twelve) hours. 02/09/22   Sloan Leiter, DO  ARIPiprazole ER (ABILIFY MAINTENA) 400 MG SRER injection Inject 2 mLs (400 mg total) into the muscle every 28 (twenty-eight) days. 06/19/18   Malvin Johns, MD  benzocaine (ORAJEL) 10 % mucosal gel Use as directed 1 Application in the mouth or throat 4 (four) times daily as needed for mouth pain. 02/10/22   Mannie Stabile, PA-C  benztropine (COGENTIN) 0.5 MG tablet Take 1 tablet (0.5 mg total) by mouth 2 (two) times daily. 06/19/18   Malvin Johns, MD  cyclobenzaprine (FLEXERIL) 10 MG tablet Take 1 tablet (10 mg total) by mouth 2 (two) times daily as needed for muscle spasms. 01/20/22   Bethann Berkshire, MD  dicyclomine (BENTYL) 20 MG tablet Take 1 tablet (20 mg total) by mouth 2 (two) times daily. 12/31/21   Linwood Dibbles, MD  etodolac (LODINE) 300 MG capsule Take 1 capsule (300 mg total) by mouth every 8 (eight) hours as needed for pain 12/31/21   Linwood Dibbles, MD  ibuprofen (ADVIL) 600 MG tablet Take 1  tablet (600 mg total) by mouth every 6 (six) hours as needed. 02/09/22   Sloan Leiter, DO  ibuprofen (ADVIL) 800 MG tablet Take 1 tablet (800 mg total) by mouth 3 (three) times daily. 01/20/22   Bethann Berkshire, MD  pantoprazole (PROTONIX) 20 MG tablet Take 1 tablet (20 mg total) by mouth daily. 02/03/22   Terrilee Files, MD  risperiDONE (RISPERDAL) 3 MG tablet Take 1 tablet (3 mg total) by mouth 2 (two) times daily. 06/19/18   Malvin Johns, MD  SUMAtriptan (IMITREX) 50 MG tablet Take 1 tablet by mouth as needed for migraine. May repeat in 2 hours if headache persists or recurs. (Please do not take more than 2 tablets in 24 hours.). 01/13/22   Peter Garter, PA      Allergies    Patient has no known allergies.    Review of Systems   Review of Systems  Physical Exam Updated Vital Signs LMP 01/25/2022 (Exact Date)  Physical Exam Vitals and nursing note reviewed.  Constitutional:      General: She is not in acute distress.    Appearance: She is well-developed. She is not diaphoretic.  HENT:     Head: Normocephalic and atraumatic.     Right Ear: External ear normal.     Left Ear: External ear normal.  Nose: Nose normal.     Mouth/Throat:     Mouth: Mucous membranes are moist.  Eyes:     General: No scleral icterus.    Conjunctiva/sclera: Conjunctivae normal.  Cardiovascular:     Rate and Rhythm: Normal rate and regular rhythm.     Heart sounds: Normal heart sounds. No murmur heard.    No friction rub. No gallop.  Pulmonary:     Effort: Pulmonary effort is normal. No respiratory distress.     Breath sounds: Normal breath sounds.  Abdominal:     General: Bowel sounds are normal. There is no distension.     Palpations: Abdomen is soft. There is no mass.     Tenderness: There is no abdominal tenderness. There is no guarding.  Musculoskeletal:     Cervical back: Normal range of motion.     Comments: No obvious swelling or deformity.  Crepitus with flexion and extension  under the kneecap.  Knee is otherwise stable.  No medial or lateral ligament laxity or tenderness.    Skin:    General: Skin is warm and dry.  Neurological:     Mental Status: She is alert and oriented to person, place, and time.  Psychiatric:        Behavior: Behavior normal.     ED Results / Procedures / Treatments   Labs (all labs ordered are listed, but only abnormal results are displayed) Labs Reviewed - No data to display  EKG None  Radiology No results found.  Procedures Procedures    Medications Ordered in ED Medications - No data to display  ED Course/ Medical Decision Making/ A&P                           Medical Decision Making Patient with knee pain.  This is to be a chronic issue for her.  Ace wrap or knee sleeve, supportive care and discharge with outpatient follow-up or return precautions.  No evidence of injury to the knee.  No new injuries suggesting she needs further imaging           Final Clinical Impression(s) / ED Diagnoses Final diagnoses:  None    Rx / DC Orders ED Discharge Orders     None         Arthor Captain, PA-C 02/13/22 2011

## 2022-02-13 NOTE — ED Triage Notes (Signed)
Pt arriving POV with right knee pain.

## 2022-02-14 ENCOUNTER — Other Ambulatory Visit: Payer: Self-pay

## 2022-02-14 ENCOUNTER — Emergency Department (HOSPITAL_COMMUNITY)
Admission: EM | Admit: 2022-02-14 | Discharge: 2022-02-15 | Disposition: A | Discharge status: Home / Self Care | Payer: Medicaid Other | Attending: Emergency Medicine | Admitting: Emergency Medicine

## 2022-02-14 ENCOUNTER — Ambulatory Visit (HOSPITAL_COMMUNITY)
Admission: EM | Admit: 2022-02-14 | Discharge: 2022-02-14 | Disposition: A | Discharge status: Home / Self Care | Payer: Medicaid Other | Attending: Urology | Admitting: Urology

## 2022-02-14 DIAGNOSIS — R109 Unspecified abdominal pain: Secondary | ICD-10-CM | POA: Insufficient documentation

## 2022-02-14 DIAGNOSIS — N9489 Other specified conditions associated with female genital organs and menstrual cycle: Secondary | ICD-10-CM | POA: Insufficient documentation

## 2022-02-14 DIAGNOSIS — F209 Schizophrenia, unspecified: Secondary | ICD-10-CM | POA: Insufficient documentation

## 2022-02-14 DIAGNOSIS — M545 Low back pain, unspecified: Secondary | ICD-10-CM | POA: Diagnosis not present

## 2022-02-14 LAB — CBC WITH DIFFERENTIAL/PLATELET
Abs Immature Granulocytes: 0 10*3/uL (ref 0.00–0.07)
Basophils Absolute: 0 10*3/uL (ref 0.0–0.1)
Basophils Relative: 1 %
Eosinophils Absolute: 0.1 10*3/uL (ref 0.0–0.5)
Eosinophils Relative: 2 %
HCT: 43.9 % (ref 36.0–46.0)
Hemoglobin: 14.2 g/dL (ref 12.0–15.0)
Immature Granulocytes: 0 %
Lymphocytes Relative: 52 %
Lymphs Abs: 2.3 10*3/uL (ref 0.7–4.0)
MCH: 30.7 pg (ref 26.0–34.0)
MCHC: 32.3 g/dL (ref 30.0–36.0)
MCV: 95 fL (ref 80.0–100.0)
Monocytes Absolute: 0.3 10*3/uL (ref 0.1–1.0)
Monocytes Relative: 8 %
Neutro Abs: 1.6 10*3/uL — ABNORMAL LOW (ref 1.7–7.7)
Neutrophils Relative %: 37 %
Platelets: 300 10*3/uL (ref 150–400)
RBC: 4.62 MIL/uL (ref 3.87–5.11)
RDW: 13 % (ref 11.5–15.5)
WBC: 4.3 10*3/uL (ref 4.0–10.5)
nRBC: 0 % (ref 0.0–0.2)

## 2022-02-14 LAB — COMPREHENSIVE METABOLIC PANEL
ALT: 17 U/L (ref 0–44)
AST: 19 U/L (ref 15–41)
Albumin: 4.2 g/dL (ref 3.5–5.0)
Alkaline Phosphatase: 65 U/L (ref 38–126)
Anion gap: 10 (ref 5–15)
BUN: 5 mg/dL — ABNORMAL LOW (ref 6–20)
CO2: 22 mmol/L (ref 22–32)
Calcium: 9.1 mg/dL (ref 8.9–10.3)
Chloride: 105 mmol/L (ref 98–111)
Creatinine, Ser: 0.88 mg/dL (ref 0.44–1.00)
GFR, Estimated: >60 mL/min (ref >60)
Glucose, Bld: 81 mg/dL (ref 70–99)
Potassium: 3.5 mmol/L (ref 3.5–5.1)
Sodium: 137 mmol/L (ref 135–145)
Total Bilirubin: 0.5 mg/dL (ref 0.3–1.2)
Total Protein: 7.9 g/dL (ref 6.5–8.1)

## 2022-02-14 LAB — URINALYSIS, ROUTINE W REFLEX MICROSCOPIC
Bilirubin Urine: NEGATIVE
Glucose, UA: NEGATIVE mg/dL
Hgb urine dipstick: NEGATIVE
Ketones, ur: NEGATIVE mg/dL
Leukocytes,Ua: NEGATIVE
Nitrite: NEGATIVE
Protein, ur: NEGATIVE mg/dL
Specific Gravity, Urine: 1.019 (ref 1.005–1.030)
pH: 5 (ref 5.0–8.0)

## 2022-02-14 LAB — LIPASE, BLOOD: Lipase: 32 U/L (ref 11–51)

## 2022-02-14 LAB — HCG, QUANTITATIVE, PREGNANCY: hCG, Beta Chain, Quant, S: <1 m[IU]/mL (ref <5)

## 2022-02-14 NOTE — ED Provider Triage Note (Signed)
Emergency Medicine Provider Triage Evaluation Note  Amanda Burnett , a 36 y.o. female  was evaluated in triage.  Pt complains of generalized abdominal pain.  She reports that "it feels like she is going to diarrhea on herself".  Denies any nausea, vomiting, hematochezia, melena, fever, urinary symptoms.  Patient has been seeing her daily over the past week. No diarrhea today.  Review of Systems  Positive:  Negative:   Physical Exam  LMP 01/25/2022 (Exact Date)  Gen:   Awake, no distress   Resp:  Normal effort  MSK:   Moves extremities without difficulty  Other:  Abdomen is soft and non tender.   Medical Decision Making  Medically screening exam initiated at 9:47 PM.  Appropriate orders placed.  Sallye Lunz was informed that the remainder of the evaluation will be completed by another provider, this initial triage assessment does not replace that evaluation, and the importance of remaining in the ED until their evaluation is complete.  Abdominal labs.    Achille Rich, [place] 02/14/22 2148

## 2022-02-14 NOTE — ED Provider Notes (Incomplete)
[facility] Medical Screening Exam  Patient Name: Amanda Burnett MRN: [id number] Date of Evaluation: 02/14/22 Chief Complaint:   Diagnosis:  Final diagnoses:  Schizophrenia, unspecified type (HCC)    History of Present illness: Amanda Burnett is a 36 y.o. female  Flowsheet Row ED from 02/13/2022 in [facility] from 02/12/2022 in [facility] ED from 02/11/2022 in [facility]  C-SSRS RISK CATEGORY No Risk No Risk No Risk       Psychiatric Specialty Exam  Presentation  General Appearance:Appropriate for Environment  Eye Contact:Good  Speech:Normal Rate  Speech Volume:Normal  Handedness:Right   Mood and Affect  Mood: Irritable  Affect: Congruent   Thought Process  Thought Processes: Goal Directed  Descriptions of Associations:Intact  Orientation:Full (Time, Place and Person)  Thought Content:Delusions  Diagnosis of Schizophrenia or Schizoaffective disorder in past: Yes  Duration of Psychotic Symptoms: Greater than six months  Hallucinations:None  Ideas of Reference:None  Suicidal Thoughts:No  Homicidal Thoughts:No   Sensorium  Memory: Immediate Good; Recent Fair; Remote Fair  Judgment: Poor  Insight: Lacking   Executive Functions  Concentration: Fair  Attention Span: Fair  Recall: Good  Fund of Knowledge: Good  Language: Good   Psychomotor Activity  Psychomotor Activity: Normal   Assets  Assets: Communication Skills; Physical Health   Sleep  Sleep: Good  Number of hours:  8   No data recorded  Physical Exam: Physical Exam Vitals and nursing note reviewed.  Constitutional:      General: She is not in acute distress.    Appearance: She is well-developed. She is not ill-appearing or diaphoretic.  HENT:     Head: Normocephalic and atraumatic.  Eyes:     Conjunctiva/sclera: Conjunctivae  normal.  Cardiovascular:     Rate and Rhythm: Normal rate.  Pulmonary:     Effort: Pulmonary effort is normal. No respiratory distress.  Abdominal:     Palpations: Abdomen is soft.     Tenderness: There is no abdominal tenderness.  Musculoskeletal:        General: No swelling.     Cervical back: Neck supple.  Skin:    General: Skin is dry.     Capillary Refill: Capillary refill takes less than 2 seconds.  Neurological:     Mental Status: She is alert and oriented to person, place, and time.  Psychiatric:        Attention and Perception: Attention and perception normal.        Mood and Affect: Angry: irritable.        Speech: Noncommunicative: verbally aggressive.        Behavior: Behavior is aggressive.        Thought Content: Thought content is delusional. Thought content is not paranoid. Thought content does not include homicidal or suicidal ideation. Thought content does not include homicidal or suicidal plan.   Review of Systems  Constitutional: Negative.   HENT: Negative.    Eyes: Negative.   Respiratory: Negative.    Cardiovascular: Negative.   Gastrointestinal: Negative.   Genitourinary: Negative.   Musculoskeletal: Negative.   Skin: Negative.   Neurological: Negative.   Endo/Heme/Allergies: Negative.   Psychiatric/Behavioral: Negative.  Negative for depression, hallucinations, substance abuse and suicidal ideas. The patient is not nervous/anxious.    Blood pressure 120/83, pulse 93, temperature 98.4 F (36.9 C), temperature source Oral, resp. rate 18, last menstrual period 01/25/2022, SpO2 100 %. There is no height or weight on file  to calculate BMI.  Musculoskeletal: Strength & Muscle Tone: {desc; muscle tone:32375} Gait & Station: {PE GAIT ED NATL:22525} Patient leans: {Patient Leans:[id number]}   [facility] MSE Discharge Disposition for Follow up and Recommendations: {[facility] MSE Recommendations:24277}   Maricela Bo, NP 02/14/2022, 8:31 PM

## 2022-02-14 NOTE — Progress Notes (Signed)
   02/14/22 1835  [facility] Triage Screening (Walk-ins at [facility] only)  How Did You Hear About [place]? Self  What Is the Reason for Your Visit/Call Today? Patient is diagnosed with Schizophrenia per chart review. He denies mental health hx and states [facility] told her she did not need therapy or medication.  She then discusses delusional ideas that she is Babs Bertin Jr's child, and her name is actually [employer].  She states she is actually 36 years old and was kidnapped at birth and raised by the lady she still lives with.  She states she has visited the ED 48 times recently, stating she doesn't feel safe returning home.  ED notes code patient as having hx of malingering.  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  Upon discussion of what we could do to help today, patient states she just needs "to talk to someone."  Patient is hyperverbal and fixated on details of the grandiose delusions, requesting to speak with a provider.  How Long Has This Been Causing You Problems? > than 6 months  Have You Recently Had Any Thoughts About Hurting Yourself? No  Are You Planning to Commit Suicide/Harm Yourself At This time? No  Have you Recently Had Thoughts About Hurting Someone Karolee Ohs? No  Are You Planning To Harm Someone At This Time? No  Are you currently experiencing any auditory, visual or other hallucinations? Yes  Please explain the hallucinations you are currently experiencing: symtoms are related to fixed grandiose delusions  Have You Used Any Alcohol or Drugs in the Past 24 Hours? No  Do you have any current medical co-morbidities that require immediate attention? No  Clinician description of patient physical appearance/behavior: Patient is hyperverbal, determined to shared details about fixed delusions.  She is difficult to redirect  and states she is here to "talk to someone."  She denies SI, HI, AVH or SA hx.  What Do You Feel Would Help You the Most Today? Treatment for Depression or other mood problem  If access to [facility] was not available, would you have sought care in the Emergency Department? No  Determination of Need Routine (7 days)  Options For Referral Medication Management;Outpatient Therapy

## 2022-02-14 NOTE — Discharge Instructions (Signed)

## 2022-02-15 ENCOUNTER — Encounter (HOSPITAL_COMMUNITY): Payer: Self-pay

## 2022-02-15 ENCOUNTER — Emergency Department (HOSPITAL_COMMUNITY)
Admission: EM | Admit: 2022-02-15 | Discharge: 2022-02-15 | Disposition: A | Discharge status: Home / Self Care | Payer: Medicaid Other | Source: Home / Self Care

## 2022-02-15 DIAGNOSIS — M545 Low back pain, unspecified: Secondary | ICD-10-CM | POA: Insufficient documentation

## 2022-02-15 NOTE — ED Provider Notes (Signed)
[facility] EMERGENCY DEPARTMENT Provider Note   CSN: [id number] Arrival date & time: 02/14/22  2130     History  Chief Complaint  Patient presents with   Abdominal Pain    Amanda Burnett is a 37 y.o. female.  37 year old female with past medical history of anxiety and depression (states previously diagnosed with schizophrenia and bipolar however these were an accurate diagnoses), presents with complaint of left side abdominal pain is an ongoing problem for several years with multiple ER workups without definitive diagnosis, no improvement with Bentyl or antacids.  States pain radiates up into her left breast, is frustrated no one has ever examined her left breast.  Reports fecal urgency at times.  Denies nausea, vomiting, fevers, chills.  No other complaints relevant to presentation today.       Home Medications Prior to Admission medications   Medication Sig Start Date End Date Taking? Authorizing Provider  acetaminophen (TYLENOL) 500 MG tablet Take 1 tablet (500 mg total) by mouth every 6 (six) hours as needed. 02/09/22   Jeanell Sparrow, DO  amoxicillin-clavulanate (AUGMENTIN) 875-125 MG tablet Take 1 tablet by mouth every 12 (twelve) hours. 02/09/22   Jeanell Sparrow, DO  ARIPiprazole ER (ABILIFY MAINTENA) 400 MG SRER injection Inject 2 mLs (400 mg total) into the muscle every 28 (twenty-eight) days. 06/19/18   Johnn Hai, MD  benzocaine (ORAJEL) 10 % mucosal gel Use as directed 1 Application in the mouth or throat 4 (four) times daily as needed for mouth pain. 02/10/22   Suzy Bouchard, PA-C  benztropine (COGENTIN) 0.5 MG tablet Take 1 tablet (0.5 mg total) by mouth 2 (two) times daily. 06/19/18   Johnn Hai, MD  cyclobenzaprine (FLEXERIL) 10 MG tablet Take 1 tablet (10 mg total) by mouth 2 (two) times daily as needed for muscle spasms. 01/20/22   Milton Ferguson, MD  dicyclomine (BENTYL) 20 MG tablet Take 1 tablet (20 mg total) by mouth 2 (two) times daily.  12/31/21   Dorie Rank, MD  etodolac (LODINE) 300 MG capsule Take 1 capsule (300 mg total) by mouth every 8 (eight) hours as needed for pain 12/31/21   Dorie Rank, MD  ibuprofen (ADVIL) 600 MG tablet Take 1 tablet (600 mg total) by mouth every 6 (six) hours as needed. 02/09/22   Jeanell Sparrow, DO  ibuprofen (ADVIL) 800 MG tablet Take 1 tablet (800 mg total) by mouth 3 (three) times daily. 01/20/22   Milton Ferguson, MD  pantoprazole (PROTONIX) 20 MG tablet Take 1 tablet (20 mg total) by mouth daily. 02/03/22   Hayden Rasmussen, MD  risperiDONE (RISPERDAL) 3 MG tablet Take 1 tablet (3 mg total) by mouth 2 (two) times daily. 06/19/18   Johnn Hai, MD  SUMAtriptan (IMITREX) 50 MG tablet Take 1 tablet by mouth as needed for migraine. May repeat in 2 hours if headache persists or recurs. (Please do not take more than 2 tablets in 24 hours.). 01/13/22   Wilnette Kales, PA      Allergies    Patient has no known allergies.    Review of Systems   Review of Systems Negative except as per HPI Physical Exam Updated Vital Signs BP (!) 136/97   Pulse (!) 113   Temp 98.3 F (36.8 C)   Resp 18   LMP 01/25/2022 (Exact Date)   SpO2 99%  Physical Exam Vitals and nursing note reviewed. Exam conducted with a chaperone present.  Constitutional:  General: She is not in acute distress.    Appearance: She is well-developed. She is not diaphoretic.  HENT:     Head: Normocephalic and atraumatic.  Cardiovascular:     Rate and Rhythm: Normal rate and regular rhythm.     Heart sounds: Normal heart sounds.  Pulmonary:     Effort: Pulmonary effort is normal.     Breath sounds: Normal breath sounds.  Chest:     Chest wall: Tenderness present. No mass, deformity or swelling.     Comments: Generally tender left breast with out mass or skin changes Abdominal:     General: Abdomen is flat.     Palpations: Abdomen is soft.     Tenderness: There is no abdominal tenderness.  Lymphadenopathy:     Upper  Body:     Left upper body: No supraclavicular, axillary or pectoral adenopathy.  Skin:    General: Skin is warm and dry.     Findings: No erythema or rash.  Neurological:     Mental Status: She is alert and oriented to person, place, and time.  Psychiatric:        Behavior: Behavior normal.     ED Results / Procedures / Treatments   Labs (all labs ordered are listed, but only abnormal results are displayed) Labs Reviewed  COMPREHENSIVE METABOLIC PANEL - Abnormal; Notable for the following components:      Result Value   BUN 5 (*)    All other components within normal limits  CBC WITH DIFFERENTIAL/PLATELET - Abnormal; Notable for the following components:   Neutro Abs 1.6 (*)    All other components within normal limits  LIPASE, BLOOD  URINALYSIS, ROUTINE W REFLEX MICROSCOPIC  HCG, QUANTITATIVE, PREGNANCY    EKG None  Radiology No results found.  Procedures Procedures    Medications Ordered in ED Medications - No data to display  ED Course/ Medical Decision Making/ A&P                           Medical Decision Making  This patient presents to the ED for concern of left side abdominal pain, this involves an extensive number of treatment options, and is a complaint that carries with it a high risk of complications and morbidity.  The differential diagnosis includes but not limited to colitis, diverticulitis, SBO, endometriosis, IBS   Co morbidities that complicate the patient evaluation  As reviewed above   Additional history obtained:  External records from outside source obtained and reviewed including CT scan dated 02/03/2022 abdomen and pelvis without acute findings.  Labs reviewed without significant findings.   Lab Tests:  I Ordered, and personally interpreted labs.  The pertinent results include: hCG negative.  Lipase normal.  Urinalysis unremarkable.  CMP without significant findings, CBC without significant findings.    Problem List / ED Course  / Critical interventions / Medication management  37 year old female with complaint of ongoing left-sided abdominal pain which radiates towards her left breast with complaint of heavy and irregular menstrual cycles which make her pain worse.  Records reviewed, no significant findings today.  Abdomen is soft and nontender.  Chaperoned exam for left breast which is unremarkable.  Patient is encouraged to follow-up with a primary care provider, is provided with resources for this as well as referral to GI given ongoing left-sided abdominal pain which has not improved with treatments offered in the ER previously. I have reviewed the patients home medicines and  have made adjustments as needed   Social Determinants of Health:  States unable to follow-up with GI as the demographics including name and date of birth and so security number are not actually hers and it would be illegal to follow-up in office with false information.  Patient is working through the court system to correct her identity.  Also notes that she will likely be moving to the [place] area to live with her biological family in the coming months.   Test / Admission - Considered:  Labs stable without significant findings.  Imaging reviewed from visit in the past 2 weeks and unremarkable at that time.  Given chronic nature of patient's symptoms, felt stable for discharge to follow-up with GI outpatient.         Final Clinical Impression(s) / ED Diagnoses Final diagnoses:  Abdominal pain, unspecified abdominal location    Rx / DC Orders ED Discharge Orders     None         Tacy Learn, PA-C 02/15/22 0820    Kemper Durie, DO 02/15/22 1556

## 2022-02-15 NOTE — ED Notes (Signed)
This RN present for PA breast exam. Pt endorses left breast tenderness on palpation. PA advised pt to follow up with primary MD for continuing breast tenderness. No abscess noted or overt abnormalities noted.

## 2022-02-15 NOTE — ED Provider Notes (Signed)
[facility] DEPT Provider Note   CSN: [id number] Arrival date & time: 02/15/22  2014     History  Chief Complaint  Patient presents with   Back Pain    Amanda Burnett is a 37 y.o. female.  She has past medical history of schizophrenia.  She presents the ER complaining of feeling like her back feels weird".  She states she is wants to have checked out.  States she was walking and she felt like her back was moving.  States is her entire spine.  Denies pain numbness tingling weakness or difficulty walking.  She states she is wanted to be checked out.  She was in the ER earlier for abdominal pain and states she is feeling better from that.  No fevers chills nausea vomiting or any other complaints   Back Pain      Home Medications Prior to Admission medications   Medication Sig Start Date End Date Taking? Authorizing Provider  acetaminophen (TYLENOL) 500 MG tablet Take 1 tablet (500 mg total) by mouth every 6 (six) hours as needed. 02/09/22   Jeanell Sparrow, DO  amoxicillin-clavulanate (AUGMENTIN) 875-125 MG tablet Take 1 tablet by mouth every 12 (twelve) hours. 02/09/22   Jeanell Sparrow, DO  ARIPiprazole ER (ABILIFY MAINTENA) 400 MG SRER injection Inject 2 mLs (400 mg total) into the muscle every 28 (twenty-eight) days. 06/19/18   Johnn Hai, MD  benzocaine (ORAJEL) 10 % mucosal gel Use as directed 1 Application in the mouth or throat 4 (four) times daily as needed for mouth pain. 02/10/22   Suzy Bouchard, PA-C  benztropine (COGENTIN) 0.5 MG tablet Take 1 tablet (0.5 mg total) by mouth 2 (two) times daily. 06/19/18   Johnn Hai, MD  cyclobenzaprine (FLEXERIL) 10 MG tablet Take 1 tablet (10 mg total) by mouth 2 (two) times daily as needed for muscle spasms. 01/20/22   Milton Ferguson, MD  dicyclomine (BENTYL) 20 MG tablet Take 1 tablet (20 mg total) by mouth 2 (two) times daily. 12/31/21   Dorie Rank, MD  etodolac (LODINE) 300 MG capsule Take 1 capsule  (300 mg total) by mouth every 8 (eight) hours as needed for pain 12/31/21   Dorie Rank, MD  ibuprofen (ADVIL) 600 MG tablet Take 1 tablet (600 mg total) by mouth every 6 (six) hours as needed. 02/09/22   Jeanell Sparrow, DO  ibuprofen (ADVIL) 800 MG tablet Take 1 tablet (800 mg total) by mouth 3 (three) times daily. 01/20/22   Milton Ferguson, MD  pantoprazole (PROTONIX) 20 MG tablet Take 1 tablet (20 mg total) by mouth daily. 02/03/22   Hayden Rasmussen, MD  risperiDONE (RISPERDAL) 3 MG tablet Take 1 tablet (3 mg total) by mouth 2 (two) times daily. 06/19/18   Johnn Hai, MD  SUMAtriptan (IMITREX) 50 MG tablet Take 1 tablet by mouth as needed for migraine. May repeat in 2 hours if headache persists or recurs. (Please do not take more than 2 tablets in 24 hours.). 01/13/22   Wilnette Kales, PA      Allergies    Patient has no known allergies.    Review of Systems   Review of Systems  Musculoskeletal:  Positive for back pain.    Physical Exam Updated Vital Signs BP 127/88 (BP Location: Right Arm)   Pulse 98   Temp 98.9 F (37.2 C) (Oral)   Resp 16   LMP 01/25/2022 (Exact Date)   SpO2 100%  Physical Exam Vitals and  nursing note reviewed.  Constitutional:      General: She is not in acute distress.    Appearance: She is well-developed.  HENT:     Head: Normocephalic and atraumatic.  Eyes:     Conjunctiva/sclera: Conjunctivae normal.  Cardiovascular:     Rate and Rhythm: Normal rate and regular rhythm.     Heart sounds: No murmur heard. Pulmonary:     Effort: Pulmonary effort is normal. No respiratory distress.     Breath sounds: Normal breath sounds.  Abdominal:     Palpations: Abdomen is soft.     Tenderness: There is no abdominal tenderness.  Musculoskeletal:        General: No swelling.     Cervical back: Normal and neck supple. No swelling, deformity, spasms, tenderness, bony tenderness or crepitus. Normal range of motion.     Thoracic back: Normal. No deformity,  spasms or tenderness. Normal range of motion. No scoliosis.     Lumbar back: Normal. No swelling, deformity, spasms, tenderness or bony tenderness.  Skin:    General: Skin is warm and dry.     Capillary Refill: Capillary refill takes less than 2 seconds.  Neurological:     Mental Status: She is alert.     Sensory: Sensation is intact.     Motor: Motor function is intact. No weakness.     Gait: Gait is intact.     Comments: Patient has 5 out of 5 strength in bilateral upper and lower extremities, she is able to walk with normal gait, normal heel toe walk.  Sensation in bilateral lower and upper extremities is normal.  Psychiatric:        Mood and Affect: Mood normal.     ED Results / Procedures / Treatments   Labs (all labs ordered are listed, but only abnormal results are displayed) Labs Reviewed - No data to display  EKG None  Radiology No results found.  Procedures Procedures    Medications Ordered in ED Medications - No data to display  ED Course/ Medical Decision Making/ A&P                           Medical Decision Making Differential diagnosis: fracture, sprain, strain, other ED course: Patient complained that she felt like her bones of her spine were moving and her back felt weird.  She got a completely reassuring exam.  Already seen today for abdominal pain with reassuring workup.  She is a very frequent visitor to the emergency department with various complaints.  There are no neurologic findings, she has no tenderness, spine seems to be normal alignment.  No indication for any imaging given no trauma no pain and no abnormal exam findings.  Patient states when she is walking in the exam room she felt a pop in her back seems to be feeling better.  Advised on follow-up and return precautions.           Final Clinical Impression(s) / ED Diagnoses Final diagnoses:  Low back pain without sciatica, unspecified back pain laterality, unspecified chronicity     Rx / DC Orders ED Discharge Orders     None         Darci Current 02/15/22 2125    Regan Lemming, MD 02/15/22 2206

## 2022-02-15 NOTE — Discharge Instructions (Signed)
Seen today for wanting to have your back evaluated.  Your exam is very reassuring.  Come back if you develop pain numbness tingling or weakness or any new or worsening symptoms otherwise follow-up with primary care.  Come back to the ER for new or worsening symptoms.

## 2022-02-15 NOTE — Discharge Instructions (Signed)
Follow up with GI- call to schedule an appointment. Recommend establishing care with a primary care provider, referral provided, please call to schedule an appointment.

## 2022-02-15 NOTE — ED Triage Notes (Signed)
Pt presents requesting to have her spine looked at. Pt denies any pain in her back, just says it "feels funny" and that it feels like "her spine is moving". No acute distress noted.
